# Patient Record
Sex: Female | Born: 1990
Health system: Southern US, Community
[De-identification: ages and names within clinical notes are randomized; demographics above are authoritative.]

## PROBLEM LIST (undated history)

## (undated) ENCOUNTER — Inpatient Hospital Stay: Payer: Self-pay

## (undated) DIAGNOSIS — E669 Obesity, unspecified: Secondary | ICD-10-CM

## (undated) DIAGNOSIS — E66811 Obesity, class 1: Secondary | ICD-10-CM

## (undated) HISTORY — DX: Obesity, class 1: E66.811

## (undated) HISTORY — DX: Obesity, unspecified: E66.9

---
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
## 2010-11-16 ENCOUNTER — Emergency Department: Payer: Self-pay | Admitting: Emergency Medicine

## 2010-11-24 ENCOUNTER — Emergency Department: Payer: Self-pay | Admitting: Emergency Medicine

## 2012-06-11 ENCOUNTER — Emergency Department: Payer: Self-pay | Admitting: Emergency Medicine

## 2012-06-11 LAB — COMPREHENSIVE METABOLIC PANEL
Albumin: 3.9 g/dL (ref 3.4–5.0)
Alkaline Phosphatase: 108 U/L (ref 50–136)
Anion Gap: 5 — ABNORMAL LOW (ref 7–16)
BUN: 10 mg/dL (ref 7–18)
Calcium, Total: 8.9 mg/dL (ref 8.5–10.1)
Co2: 27 mmol/L (ref 21–32)
EGFR (African American): >60
EGFR (Non-African Amer.): >60
Glucose: 87 mg/dL (ref 65–99)
SGOT(AST): 33 U/L (ref 15–37)
SGPT (ALT): 52 U/L (ref 12–78)
Sodium: 137 mmol/L (ref 136–145)

## 2012-06-11 LAB — URINALYSIS, COMPLETE
Ketone: NEGATIVE
Nitrite: NEGATIVE
Ph: 5 (ref 4.5–8.0)
Protein: NEGATIVE
RBC,UR: 1 /HPF (ref 0–5)
Specific Gravity: 1.028 (ref 1.003–1.030)

## 2012-06-11 LAB — CBC
HCT: 41.4 % (ref 35.0–47.0)
HGB: 14.1 g/dL (ref 12.0–16.0)
MCHC: 34.1 g/dL (ref 32.0–36.0)
MCV: 84 fL (ref 80–100)
RDW: 13.6 % (ref 11.5–14.5)
WBC: 6 10*3/uL (ref 3.6–11.0)

## 2013-04-02 ENCOUNTER — Emergency Department: Payer: Self-pay | Admitting: Internal Medicine

## 2013-04-02 LAB — URINALYSIS, COMPLETE
Bacteria: NONE SEEN
Bilirubin,UR: NEGATIVE
Glucose,UR: NEGATIVE mg/dL (ref 0–75)
Ketone: NEGATIVE
RBC,UR: 7 /HPF (ref 0–5)
Specific Gravity: 1.024 (ref 1.003–1.030)
Squamous Epithelial: <1
WBC UR: <1 /HPF (ref 0–5)

## 2013-04-02 LAB — CBC
HCT: 38.9 % (ref 35.0–47.0)
MCH: 27.8 pg (ref 26.0–34.0)
MCV: 83 fL (ref 80–100)
Platelet: 330 10*3/uL (ref 150–440)
RBC: 4.66 10*6/uL (ref 3.80–5.20)
WBC: 6 10*3/uL (ref 3.6–11.0)

## 2013-07-04 HISTORY — PX: DILATION AND CURETTAGE OF UTERUS: SHX78

## 2013-07-14 ENCOUNTER — Observation Stay: Payer: Self-pay

## 2013-07-14 LAB — HCG, QUANTITATIVE, PREGNANCY

## 2014-03-14 ENCOUNTER — Emergency Department: Payer: Self-pay | Admitting: Emergency Medicine

## 2014-03-14 LAB — URINALYSIS, COMPLETE
Bilirubin,UR: NEGATIVE
Blood: NEGATIVE
Glucose,UR: NEGATIVE mg/dL (ref 0–75)
Ketone: NEGATIVE
LEUKOCYTE ESTERASE: NEGATIVE
Nitrite: NEGATIVE
PH: 5 (ref 4.5–8.0)
Protein: NEGATIVE
Specific Gravity: 1.028 (ref 1.003–1.030)

## 2014-03-14 LAB — COMPREHENSIVE METABOLIC PANEL
ALK PHOS: 74 U/L
ALT: 21 U/L
Albumin: 3.7 g/dL (ref 3.4–5.0)
Anion Gap: 7 (ref 7–16)
BILIRUBIN TOTAL: 0.2 mg/dL (ref 0.2–1.0)
BUN: 14 mg/dL (ref 7–18)
CO2: 25 mmol/L (ref 21–32)
Calcium, Total: 8.9 mg/dL (ref 8.5–10.1)
Chloride: 106 mmol/L (ref 98–107)
Creatinine: 0.9 mg/dL (ref 0.60–1.30)
EGFR (Non-African Amer.): >60
Glucose: 98 mg/dL (ref 65–99)
Osmolality: 276 (ref 275–301)
Potassium: 3.8 mmol/L (ref 3.5–5.1)
SGOT(AST): 18 U/L (ref 15–37)
Sodium: 138 mmol/L (ref 136–145)
Total Protein: 8.4 g/dL — ABNORMAL HIGH (ref 6.4–8.2)

## 2014-03-14 LAB — PREGNANCY, URINE: PREGNANCY TEST, URINE: NEGATIVE m[IU]/mL

## 2014-03-14 LAB — CBC
HCT: 41.5 % (ref 35.0–47.0)
HGB: 13.3 g/dL (ref 12.0–16.0)
MCH: 27.5 pg (ref 26.0–34.0)
MCHC: 32.1 g/dL (ref 32.0–36.0)
MCV: 86 fL (ref 80–100)
PLATELETS: 347 10*3/uL (ref 150–440)
RBC: 4.85 10*6/uL (ref 3.80–5.20)
RDW: 13.6 % (ref 11.5–14.5)
WBC: 6.2 10*3/uL (ref 3.6–11.0)

## 2014-03-14 LAB — LIPASE, BLOOD: Lipase: 184 U/L (ref 73–393)

## 2014-05-13 ENCOUNTER — Emergency Department: Payer: Self-pay | Admitting: Emergency Medicine

## 2014-05-13 LAB — URINALYSIS, COMPLETE
BLOOD: NEGATIVE
Bacteria: NONE SEEN
Bilirubin,UR: NEGATIVE
Glucose,UR: NEGATIVE mg/dL (ref 0–75)
Ketone: NEGATIVE
LEUKOCYTE ESTERASE: NEGATIVE
Nitrite: NEGATIVE
PH: 6 (ref 4.5–8.0)
Protein: NEGATIVE
SPECIFIC GRAVITY: 1.018 (ref 1.003–1.030)
WBC UR: 1 /HPF (ref 0–5)

## 2014-05-13 LAB — CBC
HCT: 36.2 % (ref 35.0–47.0)
HGB: 11.7 g/dL — ABNORMAL LOW (ref 12.0–16.0)
MCH: 27.7 pg (ref 26.0–34.0)
MCHC: 32.2 g/dL (ref 32.0–36.0)
MCV: 86 fL (ref 80–100)
PLATELETS: 354 10*3/uL (ref 150–440)
RBC: 4.21 10*6/uL (ref 3.80–5.20)
RDW: 13.7 % (ref 11.5–14.5)
WBC: 7.7 10*3/uL (ref 3.6–11.0)

## 2014-05-13 LAB — COMPREHENSIVE METABOLIC PANEL
AST: 18 U/L (ref 15–37)
Albumin: 3.1 g/dL — ABNORMAL LOW (ref 3.4–5.0)
Alkaline Phosphatase: 70 U/L
Anion Gap: 9 (ref 7–16)
BILIRUBIN TOTAL: 0.2 mg/dL (ref 0.2–1.0)
BUN: 13 mg/dL (ref 7–18)
Calcium, Total: 8.7 mg/dL (ref 8.5–10.1)
Chloride: 108 mmol/L — ABNORMAL HIGH (ref 98–107)
Co2: 24 mmol/L (ref 21–32)
Creatinine: 0.91 mg/dL (ref 0.60–1.30)
Glucose: 91 mg/dL (ref 65–99)
Osmolality: 281 (ref 275–301)
POTASSIUM: 3.8 mmol/L (ref 3.5–5.1)
SGPT (ALT): 24 U/L
SODIUM: 141 mmol/L (ref 136–145)
Total Protein: 7.3 g/dL (ref 6.4–8.2)

## 2014-05-13 LAB — HCG, QUANTITATIVE, PREGNANCY: Beta Hcg, Quant.: 38186 m[IU]/mL — ABNORMAL HIGH

## 2014-06-16 ENCOUNTER — Encounter: Payer: Self-pay | Admitting: Obstetrics and Gynecology

## 2014-06-18 ENCOUNTER — Ambulatory Visit: Payer: Self-pay | Admitting: Obstetrics and Gynecology

## 2014-06-18 LAB — CBC WITH DIFFERENTIAL/PLATELET
Basophil #: 0 10*3/uL (ref 0.0–0.1)
Basophil %: 0.7 %
EOS ABS: 0.1 10*3/uL (ref 0.0–0.7)
Eosinophil %: 1.9 %
HCT: 38.5 % (ref 35.0–47.0)
HGB: 12.4 g/dL (ref 12.0–16.0)
Lymphocyte #: 2.2 10*3/uL (ref 1.0–3.6)
Lymphocyte %: 43.1 %
MCH: 27.8 pg (ref 26.0–34.0)
MCHC: 32.2 g/dL (ref 32.0–36.0)
MCV: 86 fL (ref 80–100)
MONOS PCT: 8.1 %
Monocyte #: 0.4 x10 3/mm (ref 0.2–0.9)
NEUTROS ABS: 2.4 10*3/uL (ref 1.4–6.5)
NEUTROS PCT: 46.2 %
Platelet: 370 10*3/uL (ref 150–440)
RBC: 4.46 10*6/uL (ref 3.80–5.20)
RDW: 13.8 % (ref 11.5–14.5)
WBC: 5.1 10*3/uL (ref 3.6–11.0)

## 2014-06-18 LAB — PROTIME-INR
INR: 0.9
Prothrombin Time: 12.4 secs (ref 11.5–14.7)

## 2014-06-18 LAB — APTT: Activated PTT: 26.7 secs (ref 23.6–35.9)

## 2014-07-04 NOTE — L&D Delivery Note (Signed)
VAGINAL DELIVERY NOTE:  Date of Delivery: 05/18/2015 Primary OB: [facility]  Gestational Age/EDD: [id number] 05/18/2015, by Last Menstrual Period Antepartum complications: Elevated AFP for 1:65 OSB Attending Physician: Ronald PippinsBeaseley/C Jameela Michna, CNM Delivery Type: spontaneous vaginal delivery  Anesthesia: epidural Laceration: none Episiotomy: none Placenta: spontaneous Intrapartum complications: None Estimated Blood Loss: 200mls GBS: Positive Procedure Details: CTSP due to pushing with caput visible. Prepared for vag delivery. Legs in stirrups. Family at bedside. Vtx, ant and post shoulder and body del at 0256 and to mom's abd, Baby cried initially, Nsy RN at bedside. CCx2 and cut per Dad. Cord blood sent. SDOP intact with 200 ml's of blood as Est. Blood loss. No lacs and no epis. Hemostasis achieved, No needles used. Bonding with baby skin to skin.   Baby: Liveborn female, Apgars 8/9, weight not done yet,  baby named "Donna Pittman"     Mom to postpartum.  Baby to Couplet care / Skin to Skin.  Donna Pittman 05/18/2015, 3:14 AM

## 2014-10-21 LAB — OB RESULTS CONSOLE GC/CHLAMYDIA
Chlamydia: NEGATIVE
Gonorrhea: NEGATIVE

## 2014-10-21 LAB — OB RESULTS CONSOLE VARICELLA ZOSTER ANTIBODY, IGG: Varicella: IMMUNE

## 2014-10-21 LAB — OB RESULTS CONSOLE RUBELLA ANTIBODY, IGM: RUBELLA: IMMUNE

## 2014-10-21 LAB — OB RESULTS CONSOLE RPR: RPR: NONREACTIVE

## 2014-10-21 LAB — OB RESULTS CONSOLE ANTIBODY SCREEN: ANTIBODY SCREEN: NEGATIVE

## 2014-10-21 LAB — OB RESULTS CONSOLE ABO/RH: RH TYPE: POSITIVE

## 2014-10-21 LAB — OB RESULTS CONSOLE HIV ANTIBODY (ROUTINE TESTING): HIV: NONREACTIVE

## 2014-10-21 LAB — OB RESULTS CONSOLE HEPATITIS B SURFACE ANTIGEN: HEP B S AG: NEGATIVE

## 2014-10-27 LAB — SURGICAL PATHOLOGY

## 2014-10-28 ENCOUNTER — Other Ambulatory Visit: Payer: Self-pay | Admitting: Obstetrics and Gynecology

## 2014-10-28 DIAGNOSIS — Z369 Encounter for antenatal screening, unspecified: Secondary | ICD-10-CM

## 2014-10-29 NOTE — Op Note (Signed)
PATIENT NAME:  [name], Donna Pittman MR#:  [id number] DATE OF BIRTH:  14-Jun-1991  DATE OF PROCEDURE:  06/18/2014  PREOPERATIVE DIAGNOSIS: Missed spontaneous abortion at 9 weeks.   POSTOPERATIVE DIAGNOSIS: Missed spontaneous abortion at 9 weeks.  PROCEDURE: Suction dilation and curettage.   SURGEON: Cline CoolsBethany E. Liat Mayol, MD   ESTIMATED BLOOD LOSS: 150 mL.   INTRAVENOUS FLUIDS USED: 800 mL.   URINE OUTPUT: 50 mL.   COMPLICATIONS: None.   ANESTHESIA: General LMA.   FINDINGS: Retroverted 9 week sized uterus.   INDICATION FOR PROCEDURE: [name] is a 24 year old gravida 2, para 0-0-1-0  with one prior 17 week fetal loss who presented for an ultrasound at which time spontaneous miscarriage was noted. The decision was made to proceed with surgical management. The [facility] at the time of the diagnosis and the decision was made to send off these products of conception for microarray testing and to proceed with maternal ATLS rule out with a lavender-topped tube sent with the products of conception for maternal contamination.   PROCEDURE: The patient was brought to the operating room where she was placed in a supine position. General anesthesia was induced without difficulty. She was then placed in dorsal lithotomy position and prepped and draped in the usual sterile fashion. A formal timeout procedure was performed with all team members present and in agreement.   She was straight catheterized for 50 mL of clear and yellow urine. Exam under anesthesia revealed that 9 week sized uterus. Her cervix was then serially dilated to 20 [place] and an 8 mm flexible suction curette was introduced carefully to the uterine fundus. The uterine contents were evacuated. Sharp curettage was used to ensure that the uterus was cleared of all clots and debris. The suction curette was re introduced or one final pass. Bimanual massage revealed a slightly boggy uterine fundus, 0.2 mg  Methergine was given IM. The uterus was then firm and bleeding was minimal.   The single-tooth tenaculum that had been used to stabilize the cervix at the time of dilation was then removed and the sites noted to be hemostatic. All instruments were removed from the patient and general anesthesia was reversed without difficulty. She tolerated this procedure well and is stable and alert in recovery.   ____________________________ Cline CoolsBethany E. Arlynn Mcdermid, MD beb:bm D: 06/18/2014 15:52:38 ET T: 06/19/2014 01:10:55 ET JOB#: [id number]  cc: Cline CoolsBethany E. Osby Sweetin, MD, <Dictator> Cline CoolsBETHANY E Adelie Croswell MD ELECTRONICALLY SIGNED 07/15/2014 9:26

## 2014-11-10 ENCOUNTER — Other Ambulatory Visit: Payer: Self-pay | Admitting: Obstetrics and Gynecology

## 2014-11-10 ENCOUNTER — Ambulatory Visit
Admission: RE | Admit: 2014-11-10 | Discharge: 2014-11-10 | Disposition: A | Discharge status: Home / Self Care | Payer: Medicaid Other | Source: Ambulatory Visit | Attending: Obstetrics & Gynecology | Admitting: Obstetrics & Gynecology

## 2014-11-10 ENCOUNTER — Ambulatory Visit
Admission: RE | Admit: 2014-11-10 | Discharge: 2014-11-10 | Disposition: A | Discharge status: Home / Self Care | Payer: Medicaid Other | Source: Ambulatory Visit | Attending: Obstetrics and Gynecology | Admitting: Obstetrics and Gynecology

## 2014-11-10 ENCOUNTER — Other Ambulatory Visit: Payer: Self-pay | Admitting: Obstetrics & Gynecology

## 2014-11-10 DIAGNOSIS — Z36 Encounter for antenatal screening of mother: Secondary | ICD-10-CM | POA: Insufficient documentation

## 2014-11-10 DIAGNOSIS — Z369 Encounter for antenatal screening, unspecified: Secondary | ICD-10-CM

## 2014-11-10 DIAGNOSIS — Z3A13 13 weeks gestation of pregnancy: Secondary | ICD-10-CM | POA: Diagnosis not present

## 2014-11-17 ENCOUNTER — Telehealth: Payer: Self-pay | Admitting: Obstetrics and Gynecology

## 2014-11-17 NOTE — Telephone Encounter (Signed)
  [name] elected to undergo First Trimester screening on 11/10/2014.  To review, first trimester screening, includes nuchal translucency ultrasound screen and/or first trimester maternal serum marker screening.  The nuchal translucency has approximately an 80% detection rate for Down syndrome and can be positive for other chromosome abnormalities as well as heart defects.  When combined with a maternal serum marker screening, the detection rate is up to 90% for Down syndrome and up to 97% for trisomy 13 and 18.     The results of the First Trimester Nuchal Translucency and Biochemical Screening were within normal range.  The risk for Down syndrome is now estimated to be less than 1 in 10,000.  The risk for Trisomy 13/18 is also estimated to be less than 1 in 10,000.  Should more definitive information be desired, we would offer amniocentesis.  Because we do not yet know the effectiveness of combined first and second trimester screening, we do not recommend a maternal serum screen to assess the chance for chromosome conditions.  However, if screening for neural tube defects is desired, maternal serum screening for AFP only can be performed between 15 and [redacted] weeks gestation.

## 2015-02-17 ENCOUNTER — Emergency Department
Admission: EM | Admit: 2015-02-17 | Discharge: 2015-02-17 | Disposition: A | Discharge status: Home / Self Care | Payer: No Typology Code available for payment source | Source: Home / Self Care | Attending: Emergency Medicine | Admitting: Emergency Medicine

## 2015-02-17 ENCOUNTER — Emergency Department: Payer: No Typology Code available for payment source

## 2015-02-17 ENCOUNTER — Encounter: Payer: Self-pay | Admitting: *Deleted

## 2015-02-17 ENCOUNTER — Observation Stay
Admission: EM | Admit: 2015-02-17 | Discharge: 2015-02-17 | Disposition: A | Discharge status: Home / Self Care | Payer: No Typology Code available for payment source | Attending: Obstetrics and Gynecology | Admitting: Obstetrics and Gynecology

## 2015-02-17 DIAGNOSIS — Y92410 Unspecified street and highway as the place of occurrence of the external cause: Secondary | ICD-10-CM

## 2015-02-17 DIAGNOSIS — M62830 Muscle spasm of back: Secondary | ICD-10-CM | POA: Insufficient documentation

## 2015-02-17 DIAGNOSIS — Y9389 Activity, other specified: Secondary | ICD-10-CM | POA: Insufficient documentation

## 2015-02-17 DIAGNOSIS — Y998 Other external cause status: Secondary | ICD-10-CM | POA: Insufficient documentation

## 2015-02-17 DIAGNOSIS — O26899 Other specified pregnancy related conditions, unspecified trimester: Secondary | ICD-10-CM | POA: Insufficient documentation

## 2015-02-17 DIAGNOSIS — O9A212 Injury, poisoning and certain other consequences of external causes complicating pregnancy, second trimester: Secondary | ICD-10-CM | POA: Insufficient documentation

## 2015-02-17 DIAGNOSIS — Z79899 Other long term (current) drug therapy: Secondary | ICD-10-CM

## 2015-02-17 DIAGNOSIS — S39012A Strain of muscle, fascia and tendon of lower back, initial encounter: Secondary | ICD-10-CM

## 2015-02-17 DIAGNOSIS — S3982XA Other specified injuries of lower back, initial encounter: Principal | ICD-10-CM | POA: Insufficient documentation

## 2015-02-17 DIAGNOSIS — Z3A27 27 weeks gestation of pregnancy: Secondary | ICD-10-CM | POA: Insufficient documentation

## 2015-02-17 DIAGNOSIS — Z3490 Encounter for supervision of normal pregnancy, unspecified, unspecified trimester: Secondary | ICD-10-CM

## 2015-02-17 LAB — CBC
HEMATOCRIT: 34.7 % — AB (ref 35.0–47.0)
HEMOGLOBIN: 11.5 g/dL — AB (ref 12.0–16.0)
MCH: 27.6 pg (ref 26.0–34.0)
MCHC: 33.2 g/dL (ref 32.0–36.0)
MCV: 83.2 fL (ref 80.0–100.0)
Platelets: 371 10*3/uL (ref 150–440)
RBC: 4.18 MIL/uL (ref 3.80–5.20)
RDW: 14 % (ref 11.5–14.5)
WBC: 10 10*3/uL (ref 3.6–11.0)

## 2015-02-17 MED ORDER — ACETAMINOPHEN 500 MG PO TABS
1000.0000 mg | ORAL_TABLET | ORAL | Status: AC
  Administered 2015-02-17: 1000 mg via ORAL
  Filled 2015-02-17: qty 2
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
## 2015-02-17 NOTE — Discharge Instructions (Signed)

## 2015-03-16 ENCOUNTER — Observation Stay
Admission: EM | Admit: 2015-03-16 | Discharge: 2015-03-16 | Disposition: A | Discharge status: Home Health | Payer: Medicaid Other | Attending: Obstetrics and Gynecology | Admitting: Obstetrics and Gynecology

## 2015-03-16 DIAGNOSIS — R198 Other specified symptoms and signs involving the digestive system and abdomen: Secondary | ICD-10-CM | POA: Diagnosis present

## 2015-03-16 DIAGNOSIS — O26899 Other specified pregnancy related conditions, unspecified trimester: Secondary | ICD-10-CM | POA: Diagnosis not present

## 2015-03-16 LAB — URINALYSIS COMPLETE WITH MICROSCOPIC (ARMC ONLY)
BILIRUBIN URINE: NEGATIVE
Glucose, UA: NEGATIVE mg/dL
HGB URINE DIPSTICK: NEGATIVE
KETONES UR: NEGATIVE mg/dL
LEUKOCYTES UA: NEGATIVE
Nitrite: NEGATIVE
PH: 6 (ref 5.0–8.0)
Protein, ur: NEGATIVE mg/dL
SPECIFIC GRAVITY, URINE: 1.015 (ref 1.005–1.030)

## 2015-03-16 LAB — FETAL FIBRONECTIN: Fetal Fibronectin: NEGATIVE

## 2015-03-16 NOTE — OB Triage Note (Signed)
Donna Pittman here with c/o abdominal tightness and LOF, both which started 1 hour ago, +FM. Denies bleeding. No intercourse last 24 hours. No recent activity changes, no abdominal trauma.

## 2015-03-16 NOTE — Progress Notes (Signed)
Pt given d/c inst and verbalized understanding.   D/C home in stable condition with FOB.

## 2015-04-21 LAB — OB RESULTS CONSOLE GBS
STREP GROUP B AG: NEGATIVE
STREP GROUP B AG: POSITIVE

## 2015-04-21 LAB — OB RESULTS CONSOLE RPR: RPR: NONREACTIVE

## 2015-04-21 LAB — OB RESULTS CONSOLE GC/CHLAMYDIA
Chlamydia: NEGATIVE
Gonorrhea: NEGATIVE

## 2015-05-11 ENCOUNTER — Emergency Department
Admission: EM | Admit: 2015-05-11 | Discharge: 2015-05-11 | Disposition: A | Discharge status: Home / Self Care | Payer: Medicaid Other | Attending: Emergency Medicine | Admitting: Emergency Medicine

## 2015-05-11 ENCOUNTER — Encounter: Payer: Self-pay | Admitting: Emergency Medicine

## 2015-05-11 DIAGNOSIS — L02412 Cutaneous abscess of left axilla: Secondary | ICD-10-CM

## 2015-05-11 DIAGNOSIS — Z3A39 39 weeks gestation of pregnancy: Secondary | ICD-10-CM | POA: Diagnosis not present

## 2015-05-11 DIAGNOSIS — O99713 Diseases of the skin and subcutaneous tissue complicating pregnancy, third trimester: Secondary | ICD-10-CM | POA: Diagnosis present

## 2015-05-11 DIAGNOSIS — Z79899 Other long term (current) drug therapy: Secondary | ICD-10-CM | POA: Diagnosis not present

## 2015-05-11 MED ORDER — LIDOCAINE-EPINEPHRINE (PF) 1 %-1:200000 IJ SOLN
30.0000 mL | Freq: Once | INTRAMUSCULAR | Status: DC
  Filled 2015-05-11: qty 30

## 2015-05-11 MED ORDER — CLINDAMYCIN HCL 300 MG PO CAPS: 300.0000 mg | ORAL_CAPSULE | Freq: Four times a day (QID) | ORAL | Status: DC

## 2015-05-11 NOTE — ED Notes (Signed)
Developed an abscess under left arm about 3 days ago. Area is draining a small amt of pus

## 2015-05-11 NOTE — ED Notes (Signed)
Pt to ed with c/o abscess to left axillary area x 4 days.  Pt reports swelling increasing each day.
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
## 2015-05-11 NOTE — Discharge Instructions (Signed)
Incision and Drainage Incision and drainage is a procedure in which a sac-like structure (cystic structure) is opened and drained. The area to be drained usually contains material such as pus, fluid, or blood.  LET YOUR CAREGIVER KNOW ABOUT:   Allergies to medicine.  Medicines taken, including vitamins, herbs, eyedrops, over-the-counter medicines, and creams.  Use of steroids (by mouth or creams).  Previous problems with anesthetics or numbing medicines.  History of bleeding problems or blood clots.  Previous surgery.  Other health problems, including diabetes and kidney problems.  Possibility of pregnancy, if this applies. RISKS AND COMPLICATIONS  Pain.  Bleeding.  Scarring.  Infection. BEFORE THE PROCEDURE  You may need to have an ultrasound or other imaging tests to see how large or deep your cystic structure is. Blood tests may also be used to determine if you have an infection or how severe the infection is. You may need to have a tetanus shot. PROCEDURE  The affected area is cleaned with a cleaning fluid. The cyst area will then be numbed with a medicine (local anesthetic). A small incision will be made in the cystic structure. A syringe or catheter may be used to drain the contents of the cystic structure, or the contents may be squeezed out. The area will then be flushed with a cleansing solution. After cleansing the area, it is often gently packed with a gauze or another wound dressing. Once it is packed, it will be covered with gauze and tape or some other type of wound dressing. AFTER THE PROCEDURE   Often, you will be allowed to go home right after the procedure.  You may be given antibiotic medicine to prevent or heal an infection.  If the area was packed with gauze or some other wound dressing, you will likely need to come back in 1 to 2 days to get it removed.  The area should heal in about 14 days.   This information is not intended to replace advice given  to you by your health care provider. Make sure you discuss any questions you have with your health care provider.   Document Released: 12/14/2000 Document Revised: 12/20/2011 Document Reviewed: 08/15/2011 Elsevier Interactive Patient Education [employer]2016 Elsevier Inc.   Keep the wound clean, dry, and covered.  Apply warm compresses to promote healing.  Take the antibiotic as directed. Follow-up with Dr. Dalbert GarnetBeasley to have the packing removed in 2-3 days.

## 2015-05-17 ENCOUNTER — Inpatient Hospital Stay
Admission: EM | Admit: 2015-05-17 | Discharge: 2015-05-19 | DRG: 775 | Disposition: A | Discharge status: Home / Self Care | Payer: Medicaid Other | Attending: Obstetrics and Gynecology | Admitting: Obstetrics and Gynecology

## 2015-05-17 ENCOUNTER — Inpatient Hospital Stay: Payer: Medicaid Other | Admitting: Anesthesiology

## 2015-05-17 ENCOUNTER — Encounter: Payer: Self-pay | Admitting: *Deleted

## 2015-05-17 DIAGNOSIS — Z3A39 39 weeks gestation of pregnancy: Secondary | ICD-10-CM

## 2015-05-17 DIAGNOSIS — O99824 Streptococcus B carrier state complicating childbirth: Principal | ICD-10-CM | POA: Diagnosis present

## 2015-05-17 LAB — TYPE AND SCREEN
ABO/RH(D): O POS
Antibody Screen: NEGATIVE

## 2015-05-17 LAB — CBC
HCT: 32.4 % — ABNORMAL LOW (ref 35.0–47.0)
HEMOGLOBIN: 10.5 g/dL — AB (ref 12.0–16.0)
MCH: 26.1 pg (ref 26.0–34.0)
MCHC: 32.4 g/dL (ref 32.0–36.0)
MCV: 80.6 fL (ref 80.0–100.0)
Platelets: 340 10*3/uL (ref 150–440)
RBC: 4.02 MIL/uL (ref 3.80–5.20)
RDW: 16.4 % — ABNORMAL HIGH (ref 11.5–14.5)
WBC: 7.3 10*3/uL (ref 3.6–11.0)

## 2015-05-17 LAB — ABO/RH: ABO/RH(D): O POS

## 2015-05-17 MED ORDER — PENICILLIN G POTASSIUM 5000000 UNITS IJ SOLR
2.5000 10*6.[IU] | INTRAVENOUS | Status: DC
  Administered 2015-05-17 – 2015-05-18 (×4): 2.5 10*6.[IU] via INTRAVENOUS
  Filled 2015-05-17 (×7): qty 2.5

## 2015-05-17 MED ORDER — LIDOCAINE HCL (PF) 1 % IJ SOLN
30.0000 mL | INTRAMUSCULAR | Status: DC | PRN
  Filled 2015-05-17: qty 30

## 2015-05-17 MED ORDER — FENTANYL 2.5 MCG/ML W/ROPIVACAINE 0.2% IN NS 100 ML EPIDURAL INFUSION (ARMC-ANES)
EPIDURAL | Status: AC
  Filled 2015-05-17: qty 100

## 2015-05-17 MED ORDER — ACETAMINOPHEN 325 MG PO TABS: 650.0000 mg | ORAL_TABLET | ORAL | Status: DC | PRN

## 2015-05-17 MED ORDER — FENTANYL 2.5 MCG/ML W/ROPIVACAINE 0.2% IN NS 100 ML EPIDURAL INFUSION (ARMC-ANES)
EPIDURAL | Status: AC
  Administered 2015-05-17: 10 mL/h via EPIDURAL
  Filled 2015-05-17: qty 100

## 2015-05-17 MED ORDER — OXYTOCIN BOLUS FROM INFUSION
500.0000 mL | INTRAVENOUS | Status: DC
  Administered 2015-05-18: 500 mL via INTRAVENOUS

## 2015-05-17 MED ORDER — BUPIVACAINE HCL (PF) 0.25 % IJ SOLN
INTRAMUSCULAR | Status: DC | PRN
  Administered 2015-05-17 (×2): 5 mL via EPIDURAL

## 2015-05-17 MED ORDER — MISOPROSTOL 25 MCG QUARTER TABLET
50.0000 ug | ORAL_TABLET | Freq: Once | ORAL | Status: AC
Start: 2015-05-17 — End: 2015-05-17
  Administered 2015-05-17: 50 ug via VAGINAL
  Filled 2015-05-17: qty 0.5

## 2015-05-17 MED ORDER — BUTORPHANOL TARTRATE 1 MG/ML IJ SOLN
1.0000 mg | INTRAMUSCULAR | Status: DC | PRN
  Administered 2015-05-17: 2 mg via INTRAVENOUS
  Filled 2015-05-17: qty 2

## 2015-05-17 MED ORDER — OXYTOCIN 40 UNITS IN LACTATED RINGERS INFUSION - SIMPLE MED
62.5000 mL/h | INTRAVENOUS | Status: DC
  Administered 2015-05-18: 62.5 mL/h via INTRAVENOUS

## 2015-05-17 MED ORDER — LACTATED RINGERS IV SOLN
INTRAVENOUS | Status: DC
  Administered 2015-05-17 (×2): via INTRAVENOUS

## 2015-05-17 MED ORDER — TERBUTALINE SULFATE 1 MG/ML IJ SOLN: 0.2500 mg | Freq: Once | INTRAMUSCULAR | Status: DC | PRN

## 2015-05-17 MED ORDER — PHENYLEPHRINE 40 MCG/ML (10ML) SYRINGE FOR IV PUSH (FOR BLOOD PRESSURE SUPPORT)
80.0000 ug | PREFILLED_SYRINGE | INTRAVENOUS | Status: DC | PRN
Start: 2015-05-17 — End: 2015-05-18
  Filled 2015-05-17: qty 2

## 2015-05-17 MED ORDER — MISOPROSTOL 25 MCG QUARTER TABLET: 25.0000 ug | ORAL_TABLET | ORAL | Status: DC | PRN

## 2015-05-17 MED ORDER — DIPHENHYDRAMINE HCL 50 MG/ML IJ SOLN: 12.5000 mg | INTRAMUSCULAR | Status: DC | PRN

## 2015-05-17 MED ORDER — FENTANYL 2.5 MCG/ML W/ROPIVACAINE 0.2% IN NS 100 ML EPIDURAL INFUSION (ARMC-ANES): 10.0000 mL/h | EPIDURAL | Status: DC

## 2015-05-17 MED ORDER — LACTATED RINGERS IV SOLN: 500.0000 mL | INTRAVENOUS | Status: DC | PRN

## 2015-05-17 MED ORDER — EPHEDRINE 5 MG/ML INJ
10.0000 mg | INTRAVENOUS | Status: DC | PRN
Start: 2015-05-17 — End: 2015-05-18
  Filled 2015-05-17: qty 2

## 2015-05-17 MED ORDER — CITRIC ACID-SODIUM CITRATE 334-500 MG/5ML PO SOLN
30.0000 mL | ORAL | Status: DC | PRN
  Filled 2015-05-17: qty 30

## 2015-05-17 MED ORDER — DEXTROSE 5 % IV SOLN
5.0000 10*6.[IU] | Freq: Once | INTRAVENOUS | Status: AC
  Administered 2015-05-17: 5 10*6.[IU] via INTRAVENOUS
  Filled 2015-05-17: qty 5

## 2015-05-17 MED ORDER — MISOPROSTOL 25 MCG QUARTER TABLET
25.0000 ug | ORAL_TABLET | ORAL | Status: DC
  Administered 2015-05-17 (×2): 25 ug via VAGINAL
  Filled 2015-05-17 (×2): qty 0.25

## 2015-05-17 MED ORDER — ONDANSETRON HCL 4 MG/2ML IJ SOLN: 4.0000 mg | Freq: Four times a day (QID) | INTRAMUSCULAR | Status: DC | PRN

## 2015-05-17 MED ORDER — LIDOCAINE-EPINEPHRINE (PF) 1.5 %-1:200000 IJ SOLN
INTRAMUSCULAR | Status: DC | PRN
  Administered 2015-05-17: 5 mL via EPIDURAL

## 2015-05-17 NOTE — Progress Notes (Signed)
S: Resting comfortably with epidural. + CTX, + LOF, No VB O: Filed Vitals:   05/17/15 2130 05/17/15 2135 05/17/15 2138 05/17/15 2140  BP:   132/93 113/59  Pulse:      Temp:      TempSrc:      Resp:      Height:      Weight:      SpO2: 100% 99%      Gen: NAD, AAOx3      Abd: FNTTP      Ext: Non-tender, Nonedmeatous    FHT: + mod var + accelerations,  1 deceleration to 90 x50 secs TOCO: Q  2 mins, IUPC inserted and 40-50 mm SVE: 3/100/vtx-1   A/P:  24 y.o. yo G4P1030 at 552w6d for Elevated OSB/AFP.   Labor:progressing  FWB: Reassuring Cat 1 tracing.   GBS: pos  Continue to monitor UC/FHT's  Start Pitocin per protocol to augment labor process.    Donna Pittman 9:51 PM

## 2015-05-17 NOTE — H&P (Signed)
HISTORY AND PHYSICAL  HISTORY OF PRESENT ILLNESS: [name] is a 24 y.o. G3P0020 at 7815w6d by LMP consistent with 10 5/7  week ultrasound with a pregnancy complicated by IUGR (resolved), Polyhydramnios (resolved) and Elevated AFP with normal US findings (Elevated for Open Spinal Bifida 1:65) presenting for induction of labor. Pt was seen by [facility] initially for NT which was neg.[facility] after elevated Open spinal bifida and cleared as normal anatomy. Pt had  prior SAB at 17 weeks and cx lengths were visualized by US and normal. NO 17 hp needed as did not have PTD. No funneling ever viewed.  PMH of MRSA tx during the pregnancy.  She has not been having contractions and denies leakage of fluid, vaginal bleeding, or decreased fetal movement.   [facility] for Elevated AFP (normal US and [facility]) consult, Poly (resolved) and IUGR (resolved). IOL recommended by [facility] High Risk Committee based on abnormal AFP and risks for post-dates for this pt. Pt is aware of the risks, benefits and alternatives with iOL and agrees with plan of care.   REVIEW OF SYSTEMS: A complete review of systems was performed and was specifically negative for headache, changes in vision, RUQ pain, shortness of breath, chest pain, lower extremity edema and dysuria. +emesis x 1 this am  HISTORY:  PMH: Trich, Abnormal OSB, Obesity, MRSA  Past Surgical History  Procedure Laterality Date  . Dilation and curettage of uterus      MEDS: PNV   No Known Allergies  OB History  Gravida Para Term Preterm AB SAB TAB Ectopic Multiple Living  3 0 0 0 2     0    # Outcome Date GA Lbr Len/2nd Weight Sex Delivery Anes PTL Lv  3 Current           2 AB           1 AB              17 week SAB with no cx incompetence detected Gynecologic History: History of Abnormal Pap Smear: not found History of STI: Trich with TOC neg after tx   Social History  Substance Use Topics  . Smoking status:  Never Smoker   . Smokeless tobacco: Not on file  . Alcohol Use: No    PHYSICAL EXAM:    GENERAL: NAD AAOx3 CHEST:CTAB no increased work of breathing CV:RRR no appreciable murmurs, rubs, gallops ABDOMEN: gravid, nontender, EFW 7ibs9oz  by [place] EXTREMITIES:  Warm and well-perfused, nontender, nonedematous,2+/0  DTRs  CERVIX: 1-2/50/ vtx-2 SPECULUM: not done  FHTs 140  baseline with modvariability accelerations and  No  decelerations  Toco:occas UC noted   PRENATAL STUDIES:  Prenatal Labs:  MBT: ; Rubella immune, Varicella immune, HIV neg, RPR neg, Hep B neg, GC/CT neg, GBS Pos , glucola WNL  Last US  32 wks  (24 %ile) placenta  Ant above the os, AF 13.9cms, [facility]  01/01/15 US 18% with fu in 3rd trimester Multiple cx lengths with no funneling seen on 12/04/14, 12/29/14, 12/24/14, 02/12/15, 03/26/15 ASSESSMENT AND PLAN:  1. Fetal Well being  - Fetal US at  32 wks  reviewed, as above - Group B Streptococcus: pos  Vtx  confirmed by CJ, CNM  2. Routine OB: - Prenatal labs reviewed, as above - Rh O pos GBS pos: PCN IV per protocol  3. Induction of Labor:  -  Contractions external toco  in place -  Pelvis proven to none -  Plan for induction with  Cytotec since pt was re-scheduled from last pm due to staffing, Cervidil will be not be placed. Will proceed with Cytotec hoping for active labor process today.        4. Post Partum Planning: - Infant feeding: Breast  5. Lt axillary abcess healing and treated by ABX and I&D

## 2015-05-17 NOTE — Progress Notes (Addendum)
3ml   1.5% Lido with epi    2 ml additional

## 2015-05-17 NOTE — Progress Notes (Signed)
IUPC placed

## 2015-05-17 NOTE — Anesthesia Preprocedure Evaluation (Signed)
Anesthesia Evaluation  Patient identified by MRN, date of birth, ID band Patient awake    Reviewed: Allergy & Precautions, NPO status , Patient's Chart, lab work & pertinent test results  Airway Mallampati: II  TM Distance: >3 FB Neck ROM: Full    Dental no notable dental hx.    Pulmonary neg pulmonary ROS,    Pulmonary exam normal breath sounds clear to auscultation       Cardiovascular negative cardio ROS Normal cardiovascular exam     Neuro/Psych negative neurological ROS  negative psych ROS   GI/Hepatic negative GI ROS, Neg liver ROS,   Endo/Other  negative endocrine ROS  Renal/GU negative Renal ROS     Musculoskeletal negative musculoskeletal ROS (+)   Abdominal Normal abdominal exam  (+)   Peds negative pediatric ROS (+)  Hematology negative hematology ROS (+)   Anesthesia Other Findings   Reproductive/Obstetrics                             Anesthesia Physical Anesthesia Plan  ASA: II  Anesthesia Plan: Epidural   Post-op Pain Management:    Induction:   Airway Management Planned: Natural Airway  Additional Equipment:   Intra-op Plan:   Post-operative Plan:   Informed Consent: I have reviewed the patients History and Physical, chart, labs and discussed the procedure including the risks, benefits and alternatives for the proposed anesthesia with the patient or authorized representative who has indicated his/her understanding and acceptance.   Dental advisory given  Plan Discussed with: CRNA and Surgeon  Anesthesia Plan Comments:         Anesthesia Quick Evaluation

## 2015-05-17 NOTE — Progress Notes (Signed)
S: Resting comfortably with Cytotec induction.. + CTX occasional, no LOF, VB O: Filed Vitals:   05/17/15 0810 05/17/15 1405 05/17/15 1406  BP: 119/75 130/85   Pulse: 92 92   Temp: 98.2 F (36.8 C)  98.4 F (36.9 C)  TempSrc: Oral  Oral  Resp: 18  18  Height: 5\' 7"  (1.702 m)    Weight: 230 lb (104.327 kg)      Gen: NAD, AAOx3      Abd: FNTTP      Ext: Non-tender, Nonedmeatous    FHT:+ mod var + accelerations no decelerations TOCO: irreg SVE:not reached per RN at last placement of Cytotec   A/P:  24 y.o. yo G3P0020 at 133w6d for Elevated AFP..   Labor: IOL  FWB: Reassuring Cat 1 tracing  GBS: pos     Sharee Pimplearon W Lener Ventresca 4:34 PM

## 2015-05-17 NOTE — Progress Notes (Signed)
S: Becoming uncomfortable with contractions. + CTX, no LOF, VB. Pt wants a break at 2130 to eat and shower before the next Cytotec is inserted. Pt wondering why things are not progressing faster.  Ceasar Mons: Filed Vitals:   05/17/15 0810 05/17/15 1405 05/17/15 1406  BP: 119/75 130/85   Pulse: 92 92   Temp: 98.2 F (36.8 C)  98.4 F (36.9 C)  TempSrc: Oral  Oral  Resp: 18  18  Height: 5\' 7"  (1.702 m)    Weight: 230 lb (104.327 kg)     Gen: NAD, AAOx3      Abd: FNTTP      Ext: Non-tender, Nonedmeatous    FHT: + mod var + accelerations no decelerations Cat 1 TOCO: Q 1 1/2 to 2 min SVE: 1/50%/vtx-2   A/P:  24 y.o. yo G3P0020 at 6887w6d for IOL for Elevated AFP with normal US. Lt axillary abcess healing after packing removed this week.   Labor: IOL at term due to 1:65 chance of OSB.  FWB: Reassuring Cat 1 tracing.  GBS: Pos with ABX being given  Lt axillary abcess with ABX coverage and I&D last week   Sharee Pimplearon W Emelyn Roen 6:39 PM

## 2015-05-17 NOTE — Progress Notes (Signed)
5ml  .25% Marcaine

## 2015-05-17 NOTE — Anesthesia Procedure Notes (Addendum)
Epidural Patient location during procedure: OB Start time: 05/17/2015 9:20 PM  Staffing Resident/CRNA: Clovis FredricksonRISSON, Perian Tedder Performed by: resident/CRNA   Preanesthetic Checklist Completed: patient identified, site marked, surgical consent, pre-op evaluation, timeout performed, IV checked, risks and benefits discussed and monitors and equipment checked  Epidural Patient position: sitting Prep: Betadine Patient monitoring: continuous pulse ox and blood pressure Approach: midline Location: L3-L4 Injection technique: LOR air  Needle:  Needle type: Tuohy  Needle gauge: 18 G Needle length: 9 cm Needle insertion depth: 8 cm Catheter type: closed end Catheter size: 20 Guage Catheter at skin depth: 12 cm Test dose: negative and 1.5% lidocaine with Epi 1:200 K  Assessment Sensory level: T9 Events: blood not aspirated, injection not painful, no injection resistance and no paresthesia

## 2015-05-17 NOTE — Progress Notes (Signed)
5ml of .25% Marcaine.

## 2015-05-17 NOTE — Progress Notes (Signed)
Catheter placed

## 2015-05-17 NOTE — Progress Notes (Signed)
S: Resting comfortably with no epidural. + CTX, no LOF, VB. IOL for elevated  O: Filed Vitals:   05/17/15 0810  BP: 119/75  Pulse: 92  Temp: 98.2 F (36.8 C)  TempSrc: Oral  Resp: 18  Height: 5\' 7"  (1.702 m)  Weight: 230 lb (104.327 kg)    Gen: NAD, AAOx3      Abd: FNTTP      Ext: Non-tender, Nonedmeatous    FHT:+ mod var + accelerations no decelerations TOCO:none SVE: not reevaluated   A/P:  24 y.o. yo G3P0020 at 5612w6d for  IOl.    Labor: none  FWB: Reassuring Cat 1 trac  GBS:pos  Cytotec per protocol  ABX  For GBS +   Sharee Pimplearon W Jones 12:04 PM

## 2015-05-17 NOTE — Progress Notes (Signed)
Sitting up for epidural

## 2015-05-18 LAB — CBC
HEMATOCRIT: 32.4 % — AB (ref 35.0–47.0)
HEMOGLOBIN: 10.1 g/dL — AB (ref 12.0–16.0)
MCH: 25.3 pg — ABNORMAL LOW (ref 26.0–34.0)
MCHC: 31.1 g/dL — AB (ref 32.0–36.0)
MCV: 81.2 fL (ref 80.0–100.0)
Platelets: 306 10*3/uL (ref 150–440)
RBC: 3.99 MIL/uL (ref 3.80–5.20)
RDW: 16.5 % — ABNORMAL HIGH (ref 11.5–14.5)
WBC: 11.2 10*3/uL — AB (ref 3.6–11.0)

## 2015-05-18 LAB — RPR: RPR Ser Ql: NONREACTIVE

## 2015-05-18 MED ORDER — OXYTOCIN 40 UNITS IN LACTATED RINGERS INFUSION - SIMPLE MED: 1.0000 m[IU]/min | INTRAVENOUS | Status: DC

## 2015-05-18 MED ORDER — BISACODYL 10 MG RE SUPP: 10.0000 mg | Freq: Every day | RECTAL | Status: DC | PRN

## 2015-05-18 MED ORDER — FLEET ENEMA 7-19 GM/118ML RE ENEM: 1.0000 | ENEMA | Freq: Every day | RECTAL | Status: DC | PRN

## 2015-05-18 MED ORDER — SENNOSIDES-DOCUSATE SODIUM 8.6-50 MG PO TABS: 2.0000 | ORAL_TABLET | ORAL | Status: DC

## 2015-05-18 MED ORDER — LIDOCAINE HCL (PF) 1 % IJ SOLN
INTRAMUSCULAR | Status: AC
  Filled 2015-05-18: qty 30

## 2015-05-18 MED ORDER — IBUPROFEN 600 MG PO TABS
600.0000 mg | ORAL_TABLET | Freq: Four times a day (QID) | ORAL | Status: DC
  Administered 2015-05-18 – 2015-05-19 (×4): 600 mg via ORAL
  Filled 2015-05-18 (×4): qty 1

## 2015-05-18 MED ORDER — ACETAMINOPHEN 325 MG PO TABS: 650.0000 mg | ORAL_TABLET | ORAL | Status: DC | PRN

## 2015-05-18 MED ORDER — SODIUM CHLORIDE 0.9 % IJ SOLN: 3.0000 mL | Freq: Two times a day (BID) | INTRAMUSCULAR | Status: DC

## 2015-05-18 MED ORDER — ONDANSETRON HCL 4 MG PO TABS: 4.0000 mg | ORAL_TABLET | ORAL | Status: DC | PRN

## 2015-05-18 MED ORDER — BENZOCAINE-MENTHOL 20-0.5 % EX AERO: 1.0000 "application " | INHALATION_SPRAY | CUTANEOUS | Status: DC | PRN

## 2015-05-18 MED ORDER — SODIUM CHLORIDE 0.9 % IV SOLN: 250.0000 mL | INTRAVENOUS | Status: DC | PRN

## 2015-05-18 MED ORDER — SODIUM CHLORIDE 0.9 % IJ SOLN: 3.0000 mL | INTRAMUSCULAR | Status: DC | PRN

## 2015-05-18 MED ORDER — OXYCODONE-ACETAMINOPHEN 5-325 MG PO TABS
1.0000 | ORAL_TABLET | ORAL | Status: DC | PRN
  Administered 2015-05-18: 1 via ORAL
  Filled 2015-05-18: qty 1

## 2015-05-18 MED ORDER — LANOLIN HYDROUS EX OINT: TOPICAL_OINTMENT | CUTANEOUS | Status: DC | PRN

## 2015-05-18 MED ORDER — PRENATAL MULTIVITAMIN CH: 1.0000 | ORAL_TABLET | Freq: Every day | ORAL | Status: DC

## 2015-05-18 MED ORDER — OXYTOCIN 10 UNIT/ML IJ SOLN
INTRAMUSCULAR | Status: AC
  Filled 2015-05-18: qty 2

## 2015-05-18 MED ORDER — MISOPROSTOL 200 MCG PO TABS
ORAL_TABLET | ORAL | Status: AC
  Filled 2015-05-18: qty 4

## 2015-05-18 MED ORDER — OXYCODONE-ACETAMINOPHEN 5-325 MG PO TABS
2.0000 | ORAL_TABLET | ORAL | Status: DC | PRN
  Administered 2015-05-18: 2 via ORAL
  Filled 2015-05-18: qty 2

## 2015-05-18 MED ORDER — TETANUS-DIPHTH-ACELL PERTUSSIS 5-2.5-18.5 LF-MCG/0.5 IM SUSP: 0.5000 mL | Freq: Once | INTRAMUSCULAR | Status: DC

## 2015-05-18 MED ORDER — OXYTOCIN 40 UNITS IN LACTATED RINGERS INFUSION - SIMPLE MED
INTRAVENOUS | Status: AC
  Filled 2015-05-18: qty 1000

## 2015-05-18 MED ORDER — ZOLPIDEM TARTRATE 5 MG PO TABS: 5.0000 mg | ORAL_TABLET | Freq: Every evening | ORAL | Status: DC | PRN

## 2015-05-18 MED ORDER — DIPHENHYDRAMINE HCL 25 MG PO CAPS: 25.0000 mg | ORAL_CAPSULE | Freq: Four times a day (QID) | ORAL | Status: DC | PRN

## 2015-05-18 MED ORDER — DIBUCAINE 1 % RE OINT: 1.0000 "application " | TOPICAL_OINTMENT | RECTAL | Status: DC | PRN

## 2015-05-18 MED ORDER — SIMETHICONE 80 MG PO CHEW: 80.0000 mg | CHEWABLE_TABLET | ORAL | Status: DC | PRN

## 2015-05-18 MED ORDER — AMMONIA AROMATIC IN INHA
RESPIRATORY_TRACT | Status: AC
  Filled 2015-05-18: qty 10

## 2015-05-18 MED ORDER — WITCH HAZEL-GLYCERIN EX PADS: 1.0000 "application " | MEDICATED_PAD | CUTANEOUS | Status: DC | PRN

## 2015-05-18 MED ORDER — POLYSACCHARIDE IRON COMPLEX 150 MG PO CAPS: 150.0000 mg | ORAL_CAPSULE | Freq: Every day | ORAL | Status: DC

## 2015-05-18 MED ORDER — OXYTOCIN 40 UNITS IN LACTATED RINGERS INFUSION - SIMPLE MED: 62.5000 mL/h | INTRAVENOUS | Status: DC | PRN

## 2015-05-18 MED ORDER — MEASLES, MUMPS & RUBELLA VAC ~~LOC~~ INJ: 0.5000 mL | INJECTION | Freq: Once | SUBCUTANEOUS | Status: DC

## 2015-05-18 MED ORDER — ONDANSETRON HCL 4 MG/2ML IJ SOLN: 4.0000 mg | INTRAMUSCULAR | Status: DC | PRN

## 2015-05-18 NOTE — Lactation Note (Signed)
This note was copied from the chart of Donna Aviva SignsKeoisha Tomkiewicz. Lactation Consultation Note  Patient Name: Donna Pittman QMVHQ'IToday's Date: 05/18/2015 Reason for consult: Initial assessment;Difficult latch   Maternal Data Does the patient have breastfeeding experience prior to this delivery?: No  Feeding Feeding Type: Breast Milk Length of feed:  (few sucks with nipple shield) Baby has not stooled or voided since birth, mom only pushed 5 min. Probable full stomach with mucous and amniotic fluid, tight jaw, not opening mouth well, will suck at breast 2-3 times and stop, even with nipple shield, will continue to offer breast and then pump breast if no latch and supplement baby with EBM LATCH Score/Interventions Latch: Repeated attempts needed to sustain latch, nipple held in mouth throughout feeding, stimulation needed to elicit sucking reflex. Intervention(s): Adjust position;Assist with latch  Audible Swallowing: None Intervention(s): Hand expression Intervention(s): Skin to skin  Type of Nipple: Flat Intervention(s): Double electric pump  Comfort (Breast/Nipple): Soft / non-tender     Hold (Positioning): Full assist, staff holds infant at breast  LATCH Score: 4  Lactation Tools Discussed/Used Tools: Nipple Shields Nipple shield size: 20 Pump Review: Setup, frequency, and cleaning Initiated by:: [name], RN Date initiated:: 05/18/15   Consult Status      Donna Pittman 05/18/2015, 4:00 PM

## 2015-05-18 NOTE — Progress Notes (Signed)
Pt transferred via w/c in stable condition to rm 345.

## 2015-05-18 NOTE — Progress Notes (Signed)
Pt assisted to bathroom to void.  Shown pericare, clean gown, pad and icepack on.  Pt assisted back to bed to wait on baby.

## 2015-05-18 NOTE — Progress Notes (Signed)
PPD #0 SVD, baby boy "Masen"  S:  Reports feeling good, but exhausted              Tolerating po/ No nausea or vomiting             Bleeding is light             Pain controlled with Motrin and Percocet             Up ad lib / ambulatory / voiding QS  Newborn breast feeding - needs assistance with positioning and latch   O:               VS: BP 139/89 mmHg  Pulse 81  Temp(Src) 98.6 F (37 C) (Oral)  Resp 20  Ht 5\' 7"  (1.702 m)  Wt 104.327 kg (230 lb)  BMI 36.01 kg/m2  SpO2 100%  LMP 08/11/2014  Breastfeeding? Unknown   LABS:              Recent Labs  05/17/15 0921 05/18/15 0618  WBC 7.3 11.2*  HGB 10.5* 10.1*  PLT 340 306               Blood type: --/--/O POS (11/13 0922)  Rubella: Immune (04/19 0000)                     I&O: Intake/Output      11/13 0701 - 11/14 0700 11/14 0701 - 11/15 0700   P.O. 360    Total Intake(mL/kg) 360 (3.5)    Urine (mL/kg/hr)  800 (3.7)   Blood 200    Total Output 200 800   Net +160 -800                      Physical Exam:             Alert and oriented X3  Lungs: Clear and unlabored  Heart: regular rate and rhythm / no mumurs  Abdomen: soft, non-tender, non-distended              Fundus: firm, non-tender, U-E  Perineum: intact  Lochia: light  Extremities: no edema, no calf pain or tenderness    A: PPD # 0   Doing well - stable status  Mild ABL Anemia compounding IDA    P: Routine post partum orders  Begin Niferex daily   See lactation today  May Saline Lock IV after infusion and D/C IV if tolerating diet  Carlean JewsMeredith Sigmon, CNM

## 2015-05-18 NOTE — Progress Notes (Signed)
Pt's position changed.  SVE 8/90/-1,

## 2015-05-18 NOTE — Progress Notes (Signed)
Pt is complete and pushing well.   CNM called to room.

## 2015-05-18 NOTE — Progress Notes (Signed)
Epidural  Cath removed.  Blue tip intact and witnessed by patient.

## 2015-05-18 NOTE — Progress Notes (Signed)
S: Resting comfortably with epidural. + CTX, + LOF,  No VB O: Filed Vitals:   05/17/15 2211 05/17/15 2225 05/17/15 2301 05/17/15 2316  BP: 112/63 104/56 99/82 110/89  Pulse: 81 85  81  Temp:   98.7 F (37.1 C)   TempSrc:   Oral   Resp:   18   Height:      Weight:      SpO2:       Gen: NAD, AAOx3      Abd: FNTTP      Ext: Non-tender, Nonedmeatous    FHT:+mod var + accelerations no decelerations, Cat 1 TOCO: Q 2-21/2 mins SVE: *last exam 3/100/vtx-1   A/P:  24 y.o. yo G4P1030 at 2446w6d for IOL.    Labor:Progressing  FWB: Reassuring Cat 1 tracing.   GBS:Pos  Continue to monitor UC/FHT's   Sharee Pimplearon W Zaila Crew 12:08 AM

## 2015-05-19 MED ORDER — OXYCODONE-ACETAMINOPHEN 5-325 MG PO TABS: 1.0000 | ORAL_TABLET | ORAL | Status: DC | PRN

## 2015-05-19 MED ORDER — LANOLIN HYDROUS EX OINT: 1.0000 "application " | TOPICAL_OINTMENT | CUTANEOUS | Status: DC | PRN

## 2015-05-19 MED ORDER — IBUPROFEN 600 MG PO TABS: 600.0000 mg | ORAL_TABLET | Freq: Four times a day (QID) | ORAL | Status: DC

## 2015-05-19 NOTE — Discharge Summary (Signed)
Obstetric Discharge Summary Reason for Admission: induction of labor Prenatal Procedures: NST and ultrasound Intrapartum Procedures: spontaneous vaginal delivery Postpartum Procedures: Rubella Ig Complications-Operative and Postpartum: none HEMOGLOBIN  Date Value Ref Range Status  05/18/2015 10.1* 12.0 - 16.0 g/dL Final   HGB  Date Value Ref Range Status  06/18/2014 12.4 12.0-16.0 g/dL Final   HCT  Date Value Ref Range Status  05/18/2015 32.4* 35.0 - 47.0 % Final  06/18/2014 38.5 35.0-47.0 % Final    Physical Exam:  General: alert, cooperative and no distress Lochia: appropriate Uterine Fundus: firm Incision: none DVT Evaluation: No evidence of DVT seen on physical exam.  Discharge Diagnoses: Term Pregnancy-delivered  Discharge Information: Date: 05/19/2015 Activity: pelvic rest Diet: routine Medications: Ibuprofen and Colace Condition: stable Instructions: refer to practice specific booklet Discharge to: home Follow-up Information    Follow up with Sharee Pimplearon W Jones, CNM In 6 weeks.   Specialty:  Obstetrics and Gynecology   Why:  For postpartum visit   Contact information:   [address] [facility] [place] [address] [phone]       Follow up with Christeen DouglasBEASLEY, Penelope Fittro, MD In 1 week.   Specialty:  Obstetrics and Gynecology   Why:  breastfeeding support if desired   Contact information:   [address] [place] [address] [phone]       Newborn Data: Live born female  Birth Weight: 7 lb 4.1 oz (3290 g) APGAR: 8, 9  Home with mother.  Christeen DouglasBEASLEY, Toshiko Kemler 05/19/2015, 9:03 AM

## 2015-05-19 NOTE — Anesthesia Postprocedure Evaluation (Signed)
  Anesthesia Post-op Note  Patient: Donna Pittman  Procedure(s) Performed: * No procedures listed *  Anesthesia type:Epidural  Patient location: 345  Post pain: Pain level controlled  Post assessment: Post-op Vital signs reviewed, Patient's Cardiovascular Status Stable, Respiratory Function Stable, Patent Airway and No signs of Nausea or vomiting  Post vital signs: Reviewed and stable  Last Vitals:  Filed Vitals:   05/19/15 0441  BP: 124/74  Pulse: 79  Temp:   Resp:     Level of consciousness: awake, alert  and patient cooperative  Complications: No apparent anesthesia complications

## 2015-05-19 NOTE — Progress Notes (Signed)
Patient understands all discharge instructions and the need to make follow up appointments. Patient discharge via wheelchair with auxillary. 

## 2015-05-19 NOTE — Discharge Instructions (Signed)
Care After Vaginal Delivery °Congratulations on your new baby!! ° °Refer to this sheet in the next few weeks. These discharge instructions provide you with information on caring for yourself after delivery. Your caregiver may also give you specific instructions. Your treatment has been planned according to the most current medical practices available, but problems sometimes occur. Call your caregiver if you have any problems or questions after you go home. ° °HOME CARE INSTRUCTIONS °· Take over-the-counter or prescription medicines only as directed by your caregiver or pharmacist. °· Do not drink alcohol, especially if you are breastfeeding or taking medicine to relieve pain. °· Do not chew or smoke tobacco. °· Do not use illegal drugs. °· Continue to use good perineal care. Good perineal care includes: °¨ Wiping your perineum from front to back. °¨ Keeping your perineum clean. °· Do not use tampons or douche until your caregiver says it is okay. °· Shower, wash your hair, and take tub baths as directed by your caregiver. °· Wear a well-fitting bra that provides breast support. °· Eat healthy foods. °· Drink enough fluids to keep your urine clear or pale yellow. °· Eat high-fiber foods such as whole grain cereals and breads, brown rice, beans, and fresh fruits and vegetables every day. These foods may help prevent or relieve constipation. °· Follow your caregiver's recommendations regarding resumption of activities such as climbing stairs, driving, lifting, exercising, or traveling. Specifically, no driving for two weeks, so that you are comfortable reacting quickly in an emergency. °· Talk to your caregiver about resuming sexual activities. Resumption of sexual activities is dependent upon your risk of infection, your rate of healing, and your comfort and desire to resume sexual activity. Usually we recommend waiting about six weeks, or until your bleeding stops and you are interested in sex. °· Try to have someone  help you with your household activities and your newborn for at least a few days after you leave the hospital. Even longer is better. °· Rest as much as possible. Try to rest or take a nap when your newborn is sleeping. Sleep deprivation can be very hard after delivery. °· Increase your activities gradually. °· Keep all of your scheduled postpartum appointments. It is very important to keep your scheduled follow-up appointments. At these appointments, your caregiver will be checking to make sure that you are healing physically and emotionally. ° °SEEK MEDICAL CARE IF:  °· You are passing large clots from your vagina.  °· You have a foul smelling discharge from your vagina. °· You have trouble urinating. °· You are urinating frequently. °· You have pain when you urinate. °· You have a change in your bowel movements. °· You have increasing redness, pain, or swelling near your vaginal incision (episiotomy) or vaginal tear. °· You have pus draining from your episiotomy or vaginal tear. °· Your episiotomy or vaginal tear is separating. °· You have painful, hard, or reddened breasts. °· You have a severe headache. °· You have blurred vision or see spots. °· You feel sad or depressed. °· You have thoughts of hurting yourself or your newborn. °· You have questions about your care, the care of your newborn, or medicines. °· You are dizzy or light-headed. °· You have a rash. °· You have nausea or vomiting. °· You were breastfeeding and have not had a menstrual period within 12 weeks after you stopped breastfeeding. °· You are not breastfeeding and have not had a menstrual period by the 12th week after delivery. °· You   have a fever. ° °SEEK IMMEDIATE MEDICAL CARE IF:  °· You have persistent pain. °· You have chest pain. °· You have shortness of breath. °· You faint. °· You have leg pain. °· You have stomach pain. °· Your vaginal bleeding saturates two or more sanitary pads in 1 hour. ° °MAKE SURE YOU:  °· Understand these  instructions. °· Will get help right away if you are not doing well or get worse. °·  °Document Released: 06/17/2000 Document Revised: 11/04/2013 Document Reviewed: 02/15/2012 ° °[employer]® Patient Information ©2015 [employer]. This information is not intended to replace advice given to you by your health care provider. Make sure you discuss any questions you have with your health care provider. ° °

## 2016-01-06 ENCOUNTER — Emergency Department
Admission: EM | Admit: 2016-01-06 | Discharge: 2016-01-06 | Disposition: A | Discharge status: Home / Self Care | Payer: Managed Care, Other (non HMO) | Attending: Emergency Medicine | Admitting: Emergency Medicine

## 2016-01-06 ENCOUNTER — Encounter: Payer: Self-pay | Admitting: *Deleted

## 2016-01-06 DIAGNOSIS — Z5321 Procedure and treatment not carried out due to patient leaving prior to being seen by health care provider: Secondary | ICD-10-CM | POA: Diagnosis not present

## 2016-01-06 DIAGNOSIS — R51 Headache: Secondary | ICD-10-CM | POA: Diagnosis not present

## 2016-01-06 MED ORDER — ACETAMINOPHEN 325 MG PO TABS
ORAL_TABLET | ORAL | Status: AC
  Administered 2016-01-06: 650 mg via ORAL
  Filled 2016-01-06: qty 2

## 2016-01-06 MED ORDER — ACETAMINOPHEN 325 MG PO TABS
650.0000 mg | ORAL_TABLET | Freq: Once | ORAL | Status: AC | PRN
  Administered 2016-01-06: 650 mg via ORAL

## 2016-01-06 NOTE — ED Notes (Signed)
Called for the second attempt from the lobby , no response .

## 2016-01-06 NOTE — ED Notes (Signed)
Called from the lobby for re-eval of vital signs , no response.

## 2016-01-06 NOTE — ED Notes (Signed)
States chills, body aches, and headache since yesterday, states nausea, states "I feel like I have been hit by a bus"

## 2017-05-16 ENCOUNTER — Encounter: Payer: Self-pay | Admitting: Certified Nurse Midwife

## 2017-05-16 ENCOUNTER — Ambulatory Visit (INDEPENDENT_AMBULATORY_CARE_PROVIDER_SITE_OTHER): Payer: 59 | Admitting: Certified Nurse Midwife

## 2017-05-16 VITALS — BP 122/80 | HR 91 | Ht 67.0 in | Wt 223.0 lb

## 2017-05-16 DIAGNOSIS — M545 Low back pain, unspecified: Secondary | ICD-10-CM

## 2017-05-16 DIAGNOSIS — Z113 Encounter for screening for infections with a predominantly sexual mode of transmission: Secondary | ICD-10-CM | POA: Diagnosis not present

## 2017-05-16 DIAGNOSIS — Z124 Encounter for screening for malignant neoplasm of cervix: Secondary | ICD-10-CM | POA: Diagnosis not present

## 2017-05-16 DIAGNOSIS — R350 Frequency of micturition: Secondary | ICD-10-CM | POA: Diagnosis not present

## 2017-05-16 DIAGNOSIS — Z13220 Encounter for screening for lipoid disorders: Secondary | ICD-10-CM | POA: Diagnosis not present

## 2017-05-16 DIAGNOSIS — E669 Obesity, unspecified: Secondary | ICD-10-CM

## 2017-05-16 DIAGNOSIS — Z23 Encounter for immunization: Secondary | ICD-10-CM

## 2017-05-16 DIAGNOSIS — Z131 Encounter for screening for diabetes mellitus: Secondary | ICD-10-CM | POA: Diagnosis not present

## 2017-05-16 DIAGNOSIS — M546 Pain in thoracic spine: Secondary | ICD-10-CM | POA: Diagnosis not present

## 2017-05-16 LAB — POCT URINALYSIS DIPSTICK
Blood, UA: NEGATIVE
GLUCOSE UA: NEGATIVE
Ketones, UA: 0.5
NITRITE UA: NEGATIVE
Spec Grav, UA: 1.025 (ref 1.010–1.025)
Urobilinogen, UA: 0.2 E.U./dL
pH, UA: 5 (ref 5.0–8.0)

## 2017-05-16 NOTE — Patient Instructions (Signed)

## 2017-05-16 NOTE — Progress Notes (Signed)
Gynecology Annual Exam  PCP: Patient, No Pcp Per  Chief Complaint:  Chief Complaint  Patient presents with  . Gynecologic Exam    History of Present Illness: Donna Pittman is a 26 y.o. [phone] who  presents for NP  gyn exam. The patient complains of 2 month history of left flank pain, bad odor to urine, and urinary frequency. She denies hematuria or dysuria.  She reports her urine is dark in color.  Her menses are absent due to her Nexplanon which was inserted postpartum 06/30/2015.Her last annual [facility] and her last Pap smear was 05/19/2014 and was normal. No history of abnormal Pap smears.  The patient is sexually active.   Her past medical history is remarkable for obesity  The patient does perform self breast exams. Her last mammogram was NA  There is no family history of breast cancer..   There is no family history of ovarian cancer.   The patient denies smoking.  She denies drinking.   She denies illegal drug use.  The patient does not exercise. Current BMI 34.92 kg/m2  The patient denies current symptoms of depression.    Review of Systems: Review of Systems  Constitutional: Negative for chills, fever and weight loss.  HENT: Negative for congestion, sinus pain and sore throat.   Eyes: Negative for blurred vision and pain.  Respiratory: Negative for hemoptysis, shortness of breath and wheezing.   Cardiovascular: Negative for chest pain, palpitations and leg swelling.  Gastrointestinal: Negative for abdominal pain, blood in stool, diarrhea, heartburn, nausea and vomiting.  Genitourinary: Positive for frequency. Negative for dysuria, hematuria and urgency.  Musculoskeletal: Positive for back pain. Negative for joint pain and myalgias.  Skin: Negative for itching and rash.  Neurological: Negative for dizziness, tingling and headaches.  Endo/Heme/Allergies: Negative for environmental allergies and polydipsia. Does not bruise/bleed easily.       Negative for hirsutism   Psychiatric/Behavioral: Negative for depression. The patient is not nervous/anxious and does not have insomnia.     Past Medical History:  Past Medical History:  Diagnosis Date  . Obesity (BMI 30.0-34.9)     Past Surgical History:  Past Surgical History:  Procedure Laterality Date  . DILATION AND CURETTAGE OF UTERUS  2015   missed abortion    Family History:  Family History  Problem Relation Age of Onset  . Diabetes Maternal Grandmother   . Hypertension Maternal Grandmother   . Stroke Maternal Grandmother   . Diabetes Paternal Grandmother   . Hypertension Paternal Grandmother   . Leukemia Paternal Grandmother     Social History:  Social History   Socioeconomic History  . Marital status: Single    Spouse name: Not on file  . Number of children: 1  . Years of education: Not on file  . Highest education level: Not on file  Social Needs  . Financial resource strain: Not on file  . Food insecurity - worry: Not on file  . Food insecurity - inability: Not on file  . Transportation needs - medical: Not on file  . Transportation needs - non-medical: Not on file  Occupational History  . 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 Smoking status: Never Smoker  . Smokeless tobacco: Never Used  Substance and Sexual Activity  . Alcohol use: No  . Drug use: No  . Sexual activity: Yes    Partners: Male    Birth control/protection: Implant  Other Topics Concern  . Not  on file  Social History Narrative  . Not on file    Allergies:  No Known Allergies  Medications: Current Outpatient Medications:  .  etonogestrel (IMPLANON) 68 MG IMPL implant, Inject into the skin., Disp: , Rfl:  Physical Exam Vitals: BP 122/80   Pulse 91   Ht 5\' 7"  (1.702 m)   Wt 223 lb (101.2 kg)   Breastfeeding? No   BMI 34.93 kg/m   General:BF in  NAD HEENT: normocephalic, anicteric Neck: no thyroid enlargement, no palpable nodules, no cervical lymphadenopathy    Pulmonary: No increased work of breathing, CTAB Cardiovascular: RRR, without murmur  Breast: Breast symmetrical, no tenderness, no palpable nodules or masses, no skin or nipple retraction present, no nipple discharge.  No axillary, infraclavicular or supraclavicular lymphadenopathy. Back:No CVAT Abdomen: Soft, non-tender, non-distended.  Umbilicus without lesions.  No hepatomegaly or masses palpable. No evidence of hernia. Genitourinary:  External: Normal external female genitalia.  Normal urethral meatus, normal Bartholin's and Skene's glands.    Vagina: Normal vaginal mucosa, no evidence of prolapse.    Cervix: Grossly normal in appearance, no bleeding, non-tender  Uterus: Anteverted, normal size, shape, and consistency, mobile, and non-tender  Adnexa: No adnexal masses, non-tender  Rectal: deferred  Lymphatic: no evidence of inguinal lymphadenopathy Extremities: no edema, erythema, or tenderness Neurologic: Grossly intact Psychiatric: mood appropriate, affect full  Urine dipstick: sp grav 1.025, +1 urobilinogen, trace leukocytes  Assessment: 26 y.o. [id number] annual gyn exam Urinary frequency/ left flank pain  R/O UTI Obesity Plan:    1) Breast cancer screening - recommend monthly self breast exam.   2) STI screening was offered and accepted. GC/ Chlamydia/ RPR/ HIV ordered  3) Cervical cancer screening - Pap was done.   4) Contraception - Nexplanon expires in 1 year  5) Routine healthcare maintenance including cholesterol and diabetes screening ordered today. Urine culture also ordered. Discussed weight loss by decreasing caloric intake and exercising regularly.  6) RTO 1 year for annual and prn  Farrel Connersolleen Brealynn Contino, CNM

## 2017-05-17 LAB — LIPID PANEL WITH LDL/HDL RATIO
CHOLESTEROL TOTAL: 170 mg/dL (ref 100–199)
HDL: 39 mg/dL — AB (ref >39)
LDL Calculated: 105 mg/dL — ABNORMAL HIGH (ref 0–99)
LDl/HDL Ratio: 2.7 ratio (ref 0.0–3.2)
Triglycerides: 132 mg/dL (ref 0–149)
VLDL CHOLESTEROL CAL: 26 mg/dL (ref 5–40)

## 2017-05-17 LAB — HIV ANTIBODY (ROUTINE TESTING W REFLEX): HIV Screen 4th Generation wRfx: NONREACTIVE

## 2017-05-17 LAB — TSH: TSH: 0.996 u[IU]/mL (ref 0.450–4.500)

## 2017-05-17 LAB — RPR: RPR Ser Ql: NONREACTIVE

## 2017-05-17 LAB — HGB A1C W/O EAG: Hgb A1c MFr Bld: 5.6 % (ref 4.8–5.6)

## 2017-05-18 ENCOUNTER — Telehealth: Payer: Self-pay | Admitting: Certified Nurse Midwife

## 2017-05-18 ENCOUNTER — Encounter: Payer: Self-pay | Admitting: Certified Nurse Midwife

## 2017-05-18 LAB — URINE CULTURE: ORGANISM ID, BACTERIA: NO GROWTH

## 2017-05-18 NOTE — Telephone Encounter (Signed)
I called patient and let her know that her urine culture was negative. Donna Pittman

## 2017-05-18 NOTE — Telephone Encounter (Signed)
Pt is calling about her prescription. Pt states she went to her pharmacy and [facility] . Please advise

## 2017-05-23 LAB — PAP IG, CT-NG, RFX HPV ALL
CHLAMYDIA, NUC. ACID AMP: POSITIVE — AB
GONOCOCCUS BY NUCLEIC ACID AMP: NEGATIVE
PAP Smear Comment: 0

## 2017-05-25 ENCOUNTER — Encounter: Payer: Self-pay | Admitting: Emergency Medicine

## 2017-05-25 ENCOUNTER — Emergency Department
Admission: EM | Admit: 2017-05-25 | Discharge: 2017-05-25 | Disposition: A | Discharge status: Home / Self Care | Payer: 59 | Attending: Emergency Medicine | Admitting: Emergency Medicine

## 2017-05-25 ENCOUNTER — Other Ambulatory Visit: Payer: Self-pay

## 2017-05-25 ENCOUNTER — Emergency Department: Payer: 59

## 2017-05-25 DIAGNOSIS — R1010 Upper abdominal pain, unspecified: Secondary | ICD-10-CM | POA: Diagnosis not present

## 2017-05-25 DIAGNOSIS — R7989 Other specified abnormal findings of blood chemistry: Secondary | ICD-10-CM

## 2017-05-25 DIAGNOSIS — Z79899 Other long term (current) drug therapy: Secondary | ICD-10-CM | POA: Insufficient documentation

## 2017-05-25 DIAGNOSIS — R197 Diarrhea, unspecified: Secondary | ICD-10-CM | POA: Insufficient documentation

## 2017-05-25 DIAGNOSIS — R112 Nausea with vomiting, unspecified: Secondary | ICD-10-CM

## 2017-05-25 DIAGNOSIS — R945 Abnormal results of liver function studies: Secondary | ICD-10-CM | POA: Insufficient documentation

## 2017-05-25 LAB — CBC WITH DIFFERENTIAL/PLATELET
BASOS ABS: 0 10*3/uL (ref 0–0.1)
Basophils Relative: 0 %
EOS PCT: 1 %
Eosinophils Absolute: 0.1 10*3/uL (ref 0–0.7)
HCT: 40.1 % (ref 35.0–47.0)
Hemoglobin: 13.3 g/dL (ref 12.0–16.0)
LYMPHS PCT: 7 %
Lymphs Abs: 0.8 10*3/uL — ABNORMAL LOW (ref 1.0–3.6)
MCH: 27.6 pg (ref 26.0–34.0)
MCHC: 33.2 g/dL (ref 32.0–36.0)
MCV: 83 fL (ref 80.0–100.0)
MONO ABS: 0.8 10*3/uL (ref 0.2–0.9)
Monocytes Relative: 7 %
Neutro Abs: 9.8 10*3/uL — ABNORMAL HIGH (ref 1.4–6.5)
Neutrophils Relative %: 85 %
PLATELETS: 305 10*3/uL (ref 150–440)
RBC: 4.83 MIL/uL (ref 3.80–5.20)
RDW: 13.6 % (ref 11.5–14.5)
WBC: 11.5 10*3/uL — ABNORMAL HIGH (ref 3.6–11.0)

## 2017-05-25 LAB — COMPREHENSIVE METABOLIC PANEL
ALT: 114 U/L — ABNORMAL HIGH (ref 14–54)
ANION GAP: 8 (ref 5–15)
AST: 237 U/L — AB (ref 15–41)
Albumin: 3.7 g/dL (ref 3.5–5.0)
Alkaline Phosphatase: 83 U/L (ref 38–126)
BUN: 12 mg/dL (ref 6–20)
CHLORIDE: 105 mmol/L (ref 101–111)
CO2: 23 mmol/L (ref 22–32)
Calcium: 8.8 mg/dL — ABNORMAL LOW (ref 8.9–10.3)
Creatinine, Ser: 0.87 mg/dL (ref 0.44–1.00)
Glucose, Bld: 130 mg/dL — ABNORMAL HIGH (ref 65–99)
POTASSIUM: 3.8 mmol/L (ref 3.5–5.1)
Sodium: 136 mmol/L (ref 135–145)
Total Bilirubin: 0.7 mg/dL (ref 0.3–1.2)
Total Protein: 7.8 g/dL (ref 6.5–8.1)

## 2017-05-25 LAB — LACTIC ACID, PLASMA
LACTIC ACID, VENOUS: 2.1 mmol/L — AB (ref 0.5–1.9)
Lactic Acid, Venous: 0.7 mmol/L (ref 0.5–1.9)

## 2017-05-25 LAB — URINALYSIS, ROUTINE W REFLEX MICROSCOPIC
BILIRUBIN URINE: NEGATIVE
Glucose, UA: NEGATIVE mg/dL
Hgb urine dipstick: NEGATIVE
KETONES UR: NEGATIVE mg/dL
Leukocytes, UA: NEGATIVE
NITRITE: NEGATIVE
Protein, ur: NEGATIVE mg/dL
Specific Gravity, Urine: 1.041 — ABNORMAL HIGH (ref 1.005–1.030)
pH: 5 (ref 5.0–8.0)

## 2017-05-25 LAB — LIPASE, BLOOD: LIPASE: 21 U/L (ref 11–51)

## 2017-05-25 MED ORDER — IBUPROFEN 600 MG PO TABS
600.0000 mg | ORAL_TABLET | Freq: Once | ORAL | Status: AC
Start: 2017-05-25 — End: 2017-05-25
  Administered 2017-05-25: 600 mg via ORAL
  Filled 2017-05-25: qty 1

## 2017-05-25 MED ORDER — SODIUM CHLORIDE 0.9 % IV BOLUS (SEPSIS)
1000.0000 mL | Freq: Once | INTRAVENOUS | Status: AC
  Administered 2017-05-25: 1000 mL via INTRAVENOUS

## 2017-05-25 MED ORDER — ONDANSETRON 4 MG PO TBDP: ORAL_TABLET | ORAL | 0 refills | Status: DC

## 2017-05-25 MED ORDER — SODIUM CHLORIDE 0.9 % IV BOLUS (SEPSIS)
1000.0000 mL | INTRAVENOUS | Status: AC
  Administered 2017-05-25: 1000 mL via INTRAVENOUS

## 2017-05-25 MED ORDER — IOPAMIDOL (ISOVUE-300) INJECTION 61%
100.0000 mL | Freq: Once | INTRAVENOUS | Status: AC | PRN
  Administered 2017-05-25: 100 mL via INTRAVENOUS

## 2017-05-25 NOTE — ED Notes (Signed)
Lab called for a critical high lactic acid of 2.1 Dr. York CeriseForbach was notified.

## 2017-05-25 NOTE — Discharge Instructions (Addendum)
You have been seen in the Emergency Department (ED) for abdominal pain, diarrhea, and some nauses and vomiting.  Your evaluation did not identify a clear cause of your symptoms but was generally reassuring.  You do have a slightly elevated heart rate and temperature (although not quite a true fever), and your lab work shows a slight elevation of your liver function tests.  These results are non-specific, especially given that your ultrasound and abdominal CT scan were both normal with no evidence of inflammation or infection, but it would be a good idea to let your primary care doctor know about these results.  Please follow up at the next available opportunity with your regular doctor regarding today?s emergent visit and the symptoms that are bothering you.  Return to the ED if your abdominal pain worsens or fails to improve, you develop bloody vomiting, bloody diarrhea, you are unable to tolerate fluids due to vomiting, fever greater than 101, or other symptoms that concern you.

## 2017-05-25 NOTE — ED Notes (Signed)
Pt went to ultrasound.

## 2017-05-25 NOTE — ED Notes (Signed)
Resting, IVF completing.

## 2017-05-25 NOTE — ED Notes (Signed)
Pt returned from CT °

## 2017-05-25 NOTE — ED Triage Notes (Addendum)
Patient ambulatory to triage with steady gait, without difficulty or distress noted; pt reports mid upper abd pain radiating into back upon awakening 2hrs PTA; diarrhea x 2 day; denies hx of same
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
## 2017-05-29 ENCOUNTER — Telehealth: Payer: Self-pay | Admitting: Certified Nurse Midwife

## 2017-05-29 ENCOUNTER — Encounter: Payer: Self-pay | Admitting: Certified Nurse Midwife

## 2017-05-29 DIAGNOSIS — A749 Chlamydial infection, unspecified: Secondary | ICD-10-CM | POA: Insufficient documentation

## 2017-05-29 MED ORDER — AZITHROMYCIN 500 MG PO TABS: ORAL_TABLET | ORAL | 0 refills | Status: DC

## 2017-05-29 NOTE — Telephone Encounter (Signed)
Lab results reviewed with patient. Has a positive Chlamydia NAAT with PAp smear. RX for Azithromycin !GM sent to pharmacy. Patient aware that partner also needs to be treated. Other labs OK.

## 2018-01-17 ENCOUNTER — Encounter: Payer: Self-pay | Admitting: Certified Nurse Midwife

## 2018-01-18 ENCOUNTER — Other Ambulatory Visit (HOSPITAL_COMMUNITY)
Admission: RE | Admit: 2018-01-18 | Discharge: 2018-01-18 | Disposition: A | Discharge status: Home / Self Care | Payer: 59 | Source: Ambulatory Visit | Attending: Maternal Newborn | Admitting: Maternal Newborn

## 2018-01-18 ENCOUNTER — Ambulatory Visit (INDEPENDENT_AMBULATORY_CARE_PROVIDER_SITE_OTHER): Payer: 59 | Admitting: Maternal Newborn

## 2018-01-18 ENCOUNTER — Encounter: Payer: Self-pay | Admitting: Maternal Newborn

## 2018-01-18 VITALS — BP 120/70 | HR 72 | Ht 67.0 in | Wt 228.0 lb

## 2018-01-18 DIAGNOSIS — R8290 Unspecified abnormal findings in urine: Secondary | ICD-10-CM

## 2018-01-18 DIAGNOSIS — R300 Dysuria: Secondary | ICD-10-CM

## 2018-01-18 DIAGNOSIS — N898 Other specified noninflammatory disorders of vagina: Secondary | ICD-10-CM

## 2018-01-18 LAB — POCT URINALYSIS DIPSTICK
Blood, UA: NEGATIVE
GLUCOSE UA: NEGATIVE
Ketones, UA: NEGATIVE
Leukocytes, UA: NEGATIVE
Nitrite, UA: NEGATIVE
PH UA: 5 (ref 5.0–8.0)
Protein, UA: POSITIVE — AB
UROBILINOGEN UA: NEGATIVE U/dL — AB

## 2018-01-18 MED ORDER — CEPHALEXIN 500 MG PO CAPS
500.0000 mg | ORAL_CAPSULE | Freq: Four times a day (QID) | ORAL | 0 refills | Status: DC
Start: 2018-01-18 — End: 2019-02-06

## 2018-01-18 NOTE — Progress Notes (Addendum)
Obstetrics & Gynecology Office Visit   Chief Complaint:  Chief Complaint  Patient presents with  . Gynecologic Exam    d/c, odor, knots under arms in the last month or so; urine is strong and dark    History of Present Illness: Patient presents today with complaints of vaginal discharge and vaginal odor for the past week or so. She has not tried any over the counter medications. No vaginal itching or irritation. She does feel some pain with intercourse. She also has urine that looks dark, and she is concerned that she has a urinary tract infection. She does not have burning when she voids but does have some discomfort. She reports increased frequency of urination.  She also complains of having swollen areas under her arms. These have resolved with the application of warm compresses, but she is worried because she has had them several times in the past month.   Review of Systems: Review of systems negative unless otherwise noted in HPI  Past Medical History:  Past Medical History:  Diagnosis Date  . Obesity (BMI 30.0-34.9)     Past Surgical History:  Past Surgical History:  Procedure Laterality Date  . DILATION AND CURETTAGE OF UTERUS  2015   missed abortion    Gynecologic History: No LMP recorded (lmp unknown). Patient has had an implant.  Obstetric History: [id number]  Family History:  Family History  Problem Relation Age of Onset  . Diabetes Maternal Grandmother   . Hypertension Maternal Grandmother   . Stroke Maternal Grandmother   . Diabetes Paternal Grandmother   . Hypertension Paternal Grandmother   . Leukemia Paternal Grandmother     Social History:  Social History   Socioeconomic History  . Marital status: Single    Spouse name: Not on file  . Number of children: 1  . Years of education: 4715  . Highest education level: Not on file  Occupational History  . Occupation: ER claims  Social Needs  . Financial resource strain: Not on file  . Food insecurity:      Worry: Not on file    Inability: Not on file  . Transportation needs:    Medical: Not on file    Non-medical: Not on file  Tobacco Use  . Smoking status: Never Smoker  . Smokeless tobacco: Never Used  Substance and Sexual Activity  . Alcohol use: No  . Drug use: No  . Sexual activity: Yes    Partners: Male    Birth control/protection: Implant  Lifestyle  . Physical activity:    Days per week: 0 days    Minutes per session: 0 min  . Stress: Not at all  Relationships  . Social connections:    Talks on phone: More than three times a week    Gets together: Three times a week    Attends religious service: More than 4 times per year    Active member of club or organization: No    Attends meetings of clubs or organizations: Never    Relationship status: Never married  . Intimate partner violence:    Fear of current or ex partner: No    Emotionally abused: No    Physically abused: No    Forced sexual activity: No  Other Topics Concern  . Not on file  Social History Narrative  . Not on file    Allergies:  No Known Allergies  Medications: Prior to Admission medications   Medication Sig Start Date End Date Taking?  Authorizing Provider  azithromycin (ZITHROMAX) 500 MG tablet Take two tablets (1000 mgm) x1 dose; may repeat if necessary 05/29/17   Farrel Conners, CNM  etonogestrel (IMPLANON) 68 MG IMPL implant Inject into the skin.    [provider]  ondansetron (ZOFRAN ODT) 4 MG disintegrating tablet Allow 1-2 tablets to dissolve in your mouth every 8 hours as needed for nausea/vomiting 05/25/17   Loleta Rose, MD    Physical Exam Vitals:  Vitals:   01/18/18 0941  BP: 120/70  Pulse: 72   No LMP recorded (lmp unknown). Patient has had an implant.  General: NAD HEENT: normocephalic, anicteric Thyroid: no enlargement, no palpable nodules Pulmonary: No increased work of breathing Abdomen: No CVAT Genitourinary:  External: Normal external female  genitalia.  Normal  urethral meatus, normal Bartholin's and Skene's glands.    Vagina: Normal vaginal mucosa, no evidence of prolapse,  small amount of white discharge present.    Cervix: Not visualized  Uterus: Non-enlarged, mobile, normal contour.  No CMT  Adnexa: ovaries non-enlarged, no adnexal masses  Rectal: deferred  Lymphatic: no evidence of inguinal lymphadenopathy Extremities: no edema, erythema, or tenderness Neurologic: Grossly intact Psychiatric: mood appropriate, affect full  Assessment: 27 y.o. [id number] with possible urinary tract infection, possible vaginal infection.  Plan: Problem List Items Addressed This Visit    None    Visit Diagnoses    Vaginal odor    -  Primary   Vaginal discharge         1) Will treat empirically for UTI as she has some discomfort. Urine culture pending.  2) Swab sent for GC, trichomonas, yeast, and BV due to odor and discharge.  3) Discussed swelling under arm. Patient aware that shaving may cause such local irritation. OK to use warm compresses but should be seen with recurrence with any signs of infection, such as fever or purulent discharge.  Marcelyn Bruins, CNM 01/18/2018  9:50 AM

## 2018-01-20 LAB — URINE CULTURE

## 2018-01-22 LAB — CERVICOVAGINAL ANCILLARY ONLY
Bacterial vaginitis: NEGATIVE
CANDIDA VAGINITIS: POSITIVE — AB
Chlamydia: NEGATIVE
NEISSERIA GONORRHEA: NEGATIVE
Trichomonas: NEGATIVE

## 2018-01-23 ENCOUNTER — Other Ambulatory Visit: Payer: Self-pay | Admitting: Maternal Newborn

## 2018-01-23 ENCOUNTER — Encounter: Payer: Self-pay | Admitting: Maternal Newborn

## 2018-01-23 DIAGNOSIS — B373 Candidiasis of vulva and vagina: Secondary | ICD-10-CM

## 2018-01-23 DIAGNOSIS — B3731 Acute candidiasis of vulva and vagina: Secondary | ICD-10-CM

## 2018-01-23 MED ORDER — FLUCONAZOLE 150 MG PO TABS
150.0000 mg | ORAL_TABLET | Freq: Once | ORAL | 0 refills | Status: AC
Start: 2018-01-23 — End: 2018-01-23

## 2018-01-23 NOTE — Progress Notes (Signed)
Sent Rx for Diflucan for candida; patient is aware.

## 2018-01-24 ENCOUNTER — Encounter: Payer: Self-pay | Admitting: Maternal Newborn

## 2018-06-05 ENCOUNTER — Encounter: Payer: Self-pay | Admitting: Maternal Newborn

## 2018-06-05 ENCOUNTER — Other Ambulatory Visit (HOSPITAL_COMMUNITY)
Admission: RE | Admit: 2018-06-05 | Discharge: 2018-06-05 | Disposition: A | Discharge status: Home / Self Care | Payer: 59 | Source: Ambulatory Visit | Attending: Maternal Newborn | Admitting: Maternal Newborn

## 2018-06-05 ENCOUNTER — Telehealth: Payer: Self-pay | Admitting: Maternal Newborn

## 2018-06-05 ENCOUNTER — Ambulatory Visit (INDEPENDENT_AMBULATORY_CARE_PROVIDER_SITE_OTHER): Payer: 59 | Admitting: Maternal Newborn

## 2018-06-05 VITALS — BP 120/80 | Ht 67.0 in | Wt 231.0 lb

## 2018-06-05 DIAGNOSIS — Z01419 Encounter for gynecological examination (general) (routine) without abnormal findings: Secondary | ICD-10-CM

## 2018-06-05 DIAGNOSIS — Z124 Encounter for screening for malignant neoplasm of cervix: Secondary | ICD-10-CM | POA: Insufficient documentation

## 2018-06-05 DIAGNOSIS — Z113 Encounter for screening for infections with a predominantly sexual mode of transmission: Secondary | ICD-10-CM

## 2018-06-05 DIAGNOSIS — Z23 Encounter for immunization: Secondary | ICD-10-CM | POA: Diagnosis not present

## 2018-06-05 NOTE — Progress Notes (Signed)
Gynecology Annual Exam  PCP: Patient, No Pcp Per  Chief Complaint:  Chief Complaint  Patient presents with  . Gynecologic Exam    History of Present Illness: Patient is a 27 y.o. [id number] presenting for an annual exam.   LMP: Patient's last menstrual period was 06/05/2018. Normally has no cycle with Nexplanon, but has been having spotting recently.  The patient is sexually active. She currently uses Nexplanon for contraception.  The patient does perform self breast exams.  There is no notable family history of breast or ovarian cancer in her family.  The patient does not have current symptoms of depression.    Review of Systems  Constitutional: Negative.   HENT: Negative.   Respiratory: Negative for cough, shortness of breath and wheezing.   Cardiovascular: Negative for chest pain and palpitations.  Gastrointestinal: Negative for abdominal pain.  Genitourinary: Negative.   Musculoskeletal: Negative.   Skin:       Recurrent boils in underarm area and recently under breasts  Neurological: Negative.   Endo/Heme/Allergies: Negative.   Psychiatric/Behavioral: Negative.   All other systems reviewed and are negative.   Past Medical History:  Past Medical History:  Diagnosis Date  . Obesity (BMI 30.0-34.9)     Past Surgical History:  Past Surgical History:  Procedure Laterality Date  . DILATION AND CURETTAGE OF UTERUS  2015   missed abortion    Gynecologic History:  Patient's last menstrual period was 06/05/2018. Contraception: Nexplanon Last Pap: 05/16/2017.  Results were: NILM   Obstetric History: [id number]  Family History:  Family History  Problem Relation Age of Onset  . Diabetes Maternal Grandmother   . Hypertension Maternal Grandmother   . Stroke Maternal Grandmother   . Diabetes Paternal Grandmother   . Hypertension Paternal Grandmother   . Leukemia Paternal Grandmother     Social History:  Social History   Socioeconomic History  . Marital  status: Single    Spouse name: Not on file  . Number of children: 1  . Years of education: 58  . Highest education level: Not on file  Occupational History  . Occupation: ER claims  Social Needs  . Financial resource strain: Not on file  . Food insecurity:    Worry: Not on file    Inability: Not on file  . Transportation needs:    Medical: Not on file    Non-medical: Not on file  Tobacco Use  . Smoking status: Never Smoker  . Smokeless tobacco: Never Used  Substance and Sexual Activity  . Alcohol use: No  . Drug use: No  . Sexual activity: Yes    Partners: Male    Birth control/protection: Implant  Lifestyle  . Physical activity:    Days per week: 0 days    Minutes per session: 0 min  . Stress: Not at all  Relationships  . Social connections:    Talks on phone: More than three times a week    Gets together: Three times a week    Attends religious service: More than 4 times per year    Active member of club or organization: No    Attends meetings of clubs or organizations: Never    Relationship status: Never married  . Intimate partner violence:    Fear of current or ex partner: No    Emotionally abused: No    Physically abused: No    Forced sexual activity: No  Other Topics Concern  . Not on file  Social History Narrative  .  Not on file    Allergies:  No Known Allergies  Medications: Prior to Admission medications   Medication Sig Start Date End Date Taking? Authorizing Provider  etonogestrel (IMPLANON) 68 MG IMPL implant Inject into the skin.   Yes [provider]  cephALEXin (KEFLEX) 500 MG capsule Take 1 capsule (500 mg total) by mouth 4 (four) times daily. Patient not taking: Reported on 06/05/2018 01/18/18   Oswaldo ConroySchmid, Yaneth Fairbairn Y, CNM    Physical Exam Vitals: Blood pressure 120/80, height 5\' 7"  (1.702 m), weight 231 lb (104.8 kg), last menstrual period 06/05/2018.  General: NAD HEENT: normocephalic, anicteric Thyroid: no enlargement, no palpable  nodules Pulmonary: No increased work of breathing, CTAB Cardiovascular: RRR, no murmurs, rubs or gallops Breast: Breasts symmetrical, no tenderness, no palpable nodules or masses, no skin or nipple retraction present, no nipple discharge.  No axillary or supraclavicular lymphadenopathy. Abdomen: Soft, non-tender, non-distended.  Umbilicus without lesions.  No hepatomegaly, splenomegaly or masses palpable. No evidence of hernia  Genitourinary:  External: Normal external female genitalia.  Normal urethral  meatus, normal Bartholin's and Skene's glands.    Vagina: Normal vaginal mucosa, no evidence of prolapse.    Cervix: Grossly normal in appearance, no bleeding  Uterus: Non-enlarged, mobile, normal contour.  No CMT  Adnexa: ovaries non-enlarged, no adnexal masses  Rectal: deferred  Lymphatic: no evidence of inguinal lymphadenopathy Extremities: no edema, erythema, or tenderness Neurologic: Grossly intact Psychiatric: mood appropriate, affect full  Assessment: 27 y.o. [id number] routine annual exam.  Plan: Problem List Items Addressed This Visit    None    Visit Diagnoses    Women's annual routine gynecological examination    -  Primary   Relevant Orders   HEP, RPR, HIV Panel   Cytology - PAP   Screen for STD (sexually transmitted disease)       Relevant Orders   HEP, RPR, HIV Panel   Cytology - PAP   Pap smear for cervical cancer screening       Relevant Orders   Cytology - PAP   Need for immunization against influenza       Relevant Orders   Flu Vaccine QUAD 36+ mos IM (Completed)     1) STI screening was offered and accepted.  2) ASCCP guidelines and rationale discussed.  Patient opts for yearly screening interval.  3) Contraception - Education given regarding options for contraception. She desires to continue with Nexplanon at present and will make an appointment for removal and reinsertion; device expiration at the end of December.  4) Routine healthcare maintenance  including cholesterol, diabetes screening: declines today. Done last year, consider at next annual.   5) She has had recurrent boils under her armpits and recently one under her right breast. She is able to express pus from these, and then they heal after a time. Discussed that this may be caused by Staph bacterial infection and a culture can be sent upon recurrence if a sample can be collected from the wound.  6) Follow up 1 year for routine annual exam.  Marcelyn BruinsJacelyn Shellye Zandi, CNM 06/05/2018  2:53 PM

## 2018-06-05 NOTE — Telephone Encounter (Signed)
Patient coming in on 07/25/18 at 1:30 with JS for nexplanon removal and reinsertion

## 2018-06-06 LAB — HEP, RPR, HIV PANEL
HEP B S AG: NEGATIVE
HIV SCREEN 4TH GENERATION: NONREACTIVE
RPR: NONREACTIVE

## 2018-06-08 LAB — CYTOLOGY - PAP
Chlamydia: NEGATIVE
DIAGNOSIS: NEGATIVE
Neisseria Gonorrhea: NEGATIVE
Trichomonas: NEGATIVE

## 2018-06-13 NOTE — Telephone Encounter (Signed)
Noted. Will order to arrive by apt date/time. 

## 2018-07-25 ENCOUNTER — Encounter: Payer: Self-pay | Admitting: Maternal Newborn

## 2018-07-25 ENCOUNTER — Ambulatory Visit: Payer: 59 | Admitting: Maternal Newborn

## 2018-07-25 VITALS — BP 118/76 | Ht 67.0 in | Wt 232.0 lb

## 2018-07-25 DIAGNOSIS — Z3049 Encounter for surveillance of other contraceptives: Secondary | ICD-10-CM | POA: Diagnosis not present

## 2018-07-25 DIAGNOSIS — Z30017 Encounter for initial prescription of implantable subdermal contraceptive: Secondary | ICD-10-CM

## 2018-07-25 DIAGNOSIS — Z3046 Encounter for surveillance of implantable subdermal contraceptive: Secondary | ICD-10-CM

## 2018-07-25 NOTE — Progress Notes (Signed)
  GYNECOLOGY PROCEDURE NOTE  Patient is a 28 y.o. [id number] presenting for Nexplanon removal and re-insertion as her desired means of contraception.  She provided informed consent, signed copy in the chart, time out was performed.  No LMP recorded. Patient has had an implant.  She understands that Nexplanon is a progesterone only therapy, and that patients often patients have irregular and unpredictable vaginal bleeding or amenorrhea. She understands that other side effects are possible related to systemic progesterone, including but not limited to, headaches, breast tenderness, nausea, and irritability.   Procedure: Patient placed in dorsal supine with left arm above head, elbow flexed at 90 degrees, arm resting on examination table. Nexplanon identified without problems. Site marked.  Betadine scrub performed.  1 ml of 1% lidocaine injected under device without problems.  Sterile gloves applied.  Small 0.5cm incision made at distal tip of device with 11 blade scalpel.  Nexplanon brought to incision and grasped with a small kelly clamp.  Nexplanon was removed intact.  Pressure applied to incision.  Hemostasis obtained.  Additional 1 ml of lidocaine was injected along the insertion site. The site was cleaned again and new sterile gloves applied.  The Nexplanon device was confirmed in the needle before inserting the full length of the needle into the pre-existing site, tenting up the skin as the needle was advanced.  The rod was then deployed by pulling back the slider per the manufacturer's recommendation.  The implant was palpable by the clinician as well as the patient.  The insertion site covered dressed with a large band aid before applying a Kerlex bandage pressure dressing. Minimal blood loss was noted during the procedure.  The patient tolerated the procedure well.   She was instructed to wear the bandage for 24 hours, call with any signs of infection.  She was given the Nexplanon card and instructed  to have the rod removed in 3 years.  Marcelyn Bruins, CNM 07/25/2018

## 2018-10-27 ENCOUNTER — Emergency Department: Payer: 59

## 2018-10-27 ENCOUNTER — Other Ambulatory Visit: Payer: Self-pay

## 2018-10-27 ENCOUNTER — Emergency Department
Admission: EM | Admit: 2018-10-27 | Discharge: 2018-10-27 | Disposition: A | Discharge status: Home / Self Care | Payer: 59 | Attending: Emergency Medicine | Admitting: Emergency Medicine

## 2018-10-27 DIAGNOSIS — X501XXA Overexertion from prolonged static or awkward postures, initial encounter: Secondary | ICD-10-CM | POA: Insufficient documentation

## 2018-10-27 DIAGNOSIS — Y929 Unspecified place or not applicable: Secondary | ICD-10-CM | POA: Insufficient documentation

## 2018-10-27 DIAGNOSIS — Y939 Activity, unspecified: Secondary | ICD-10-CM | POA: Insufficient documentation

## 2018-10-27 DIAGNOSIS — Z79899 Other long term (current) drug therapy: Secondary | ICD-10-CM | POA: Insufficient documentation

## 2018-10-27 DIAGNOSIS — S93601A Unspecified sprain of right foot, initial encounter: Secondary | ICD-10-CM | POA: Diagnosis not present

## 2018-10-27 DIAGNOSIS — Y999 Unspecified external cause status: Secondary | ICD-10-CM | POA: Diagnosis not present

## 2018-10-27 DIAGNOSIS — M79671 Pain in right foot: Secondary | ICD-10-CM

## 2018-10-27 DIAGNOSIS — S99921A Unspecified injury of right foot, initial encounter: Secondary | ICD-10-CM | POA: Diagnosis present

## 2018-10-27 MED ORDER — HYDROCODONE-ACETAMINOPHEN 5-325 MG PO TABS
1.0000 | ORAL_TABLET | Freq: Once | ORAL | Status: AC
  Administered 2018-10-27: 03:00:00 1 via ORAL
  Filled 2018-10-27: qty 1

## 2018-10-27 MED ORDER — IBUPROFEN 800 MG PO TABS: 800.0000 mg | ORAL_TABLET | Freq: Three times a day (TID) | ORAL | 0 refills | Status: DC | PRN

## 2018-10-27 MED ORDER — HYDROCODONE-ACETAMINOPHEN 5-325 MG PO TABS: 1.0000 | ORAL_TABLET | Freq: Four times a day (QID) | ORAL | 0 refills | Status: DC | PRN

## 2018-10-27 MED ORDER — IBUPROFEN 800 MG PO TABS
800.0000 mg | ORAL_TABLET | Freq: Once | ORAL | Status: AC
  Administered 2018-10-27: 03:00:00 800 mg via ORAL
  Filled 2018-10-27: qty 1

## 2018-10-27 NOTE — ED Notes (Signed)
Reviewed discharge instructions, follow-up care, elevation, cryotherapy, and crutch use, and prescriptions with patient. Patient verbalized understanding of all information reviewed. Patient stable, with no distress noted at this time.

## 2018-10-27 NOTE — Discharge Instructions (Addendum)
1.  You may take pain medicines as needed (Motrin/Norco #15). 2.  You may remove Velcro splint as needed. 3.  Use crutches to help you walk.  You may put weight on the right foot as tolerated. 4.  Elevate affected area and apply ice several times daily to reduce swelling. 5.  Return to the ER for worsening symptoms, persistent vomiting, difficulty breathing or other concerns.

## 2018-10-27 NOTE — ED Notes (Signed)
Right ankle splint placed by Myah, NT and Kennedy Bucker, NT. Pt tolerated well with no issue.

## 2018-10-27 NOTE — ED Triage Notes (Signed)
Patient reports fell earlier tonight and reports pain to her right foot.
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
## 2019-02-06 ENCOUNTER — Ambulatory Visit (INDEPENDENT_AMBULATORY_CARE_PROVIDER_SITE_OTHER): Payer: Self-pay | Admitting: Maternal Newborn

## 2019-02-06 ENCOUNTER — Other Ambulatory Visit (HOSPITAL_COMMUNITY)
Admission: RE | Admit: 2019-02-06 | Discharge: 2019-02-06 | Disposition: A | Discharge status: Home / Self Care | Payer: BC Managed Care – PPO | Source: Ambulatory Visit | Attending: Maternal Newborn | Admitting: Maternal Newborn

## 2019-02-06 ENCOUNTER — Encounter: Payer: Self-pay | Admitting: Maternal Newborn

## 2019-02-06 ENCOUNTER — Other Ambulatory Visit: Payer: Self-pay

## 2019-02-06 VITALS — BP 118/80 | Ht 67.0 in | Wt 218.2 lb

## 2019-02-06 DIAGNOSIS — N9089 Other specified noninflammatory disorders of vulva and perineum: Secondary | ICD-10-CM | POA: Insufficient documentation

## 2019-02-06 DIAGNOSIS — N898 Other specified noninflammatory disorders of vagina: Secondary | ICD-10-CM

## 2019-02-06 DIAGNOSIS — B373 Candidiasis of vulva and vagina: Secondary | ICD-10-CM

## 2019-02-06 DIAGNOSIS — R399 Unspecified symptoms and signs involving the genitourinary system: Secondary | ICD-10-CM

## 2019-02-06 DIAGNOSIS — B3731 Acute candidiasis of vulva and vagina: Secondary | ICD-10-CM

## 2019-02-06 DIAGNOSIS — N611 Abscess of the breast and nipple: Secondary | ICD-10-CM

## 2019-02-06 DIAGNOSIS — Z113 Encounter for screening for infections with a predominantly sexual mode of transmission: Secondary | ICD-10-CM

## 2019-02-06 LAB — POCT URINALYSIS DIPSTICK
Bilirubin, UA: NEGATIVE
Blood, UA: NEGATIVE
Glucose, UA: NEGATIVE
Ketones, UA: NEGATIVE
Leukocytes, UA: NEGATIVE
Nitrite, UA: NEGATIVE
Protein, UA: POSITIVE — AB
Spec Grav, UA: 1.01 (ref 1.010–1.025)
Urobilinogen, UA: NEGATIVE E.U./dL — AB
pH, UA: 7.5 (ref 5.0–8.0)

## 2019-02-06 LAB — POCT WET PREP (WET MOUNT)
Clue Cells Wet Prep Whiff POC: NEGATIVE
Trichomonas Wet Prep HPF POC: ABSENT

## 2019-02-06 MED ORDER — FLUCONAZOLE 150 MG PO TABS: 150.0000 mg | ORAL_TABLET | Freq: Once | ORAL | 0 refills | Status: AC

## 2019-02-06 NOTE — Progress Notes (Signed)
Obstetrics & Gynecology Office Visit   Chief Complaint:  Chief Complaint  Patient presents with  . Breast exam    lump on right breast, painful when touches noticed this past Sat  . Urinary Tract Infection    frequency and burning when urinates, strong odor in urine no blood/pain  . Vaginal Itching    no discharge, no sour/fishy odor    History of Present Illness: Donna Pittman has noticed some burning when she urinates and vulvar itching that began last Friday (7/31) and got worse on Saturday; these symptoms have persisted. She also has vulvar irritation. She did have intercourse using a condom on Thursday and wonders if this could have something to do with her symptoms. She has tried using Monistat without relief. She has not noticed a thick or thin vaginal discharge or any odor. She has not had pelvic pain or cramping.  She has also noticed a lump on the underside of her right breast, slightly above her bra line. She was able to express some blood and pus from the site. She has been using Neosporin on it. It is tender to the touch. She thought that it may be a boil, but wanted to be sure that it was not related to a breast mass.   Review of Systems: Review of systems negative unless otherwise noted in HPI.  Past Medical History:  Past Medical History:  Diagnosis Date  . Obesity (BMI 30.0-34.9)     Past Surgical History:  Past Surgical History:  Procedure Laterality Date  . DILATION AND CURETTAGE OF UTERUS  2015   missed abortion    Gynecologic History: No LMP recorded. Patient has had an implant.  Obstetric History: [id number]  Family History:  Family History  Problem Relation Age of Onset  . Diabetes Maternal Grandmother   . Hypertension Maternal Grandmother   . Stroke Maternal Grandmother   . Diabetes Paternal Grandmother   . Hypertension Paternal Grandmother   . Leukemia Paternal Grandmother     Social History:  Social History   Socioeconomic History  . Marital  status: Single    Spouse name: Not on file  . Number of children: 1  . Years of education: 3415  . Highest education level: Not on file  Occupational History  . Occupation: ER claims  Social Needs  . Financial resource strain: Not on file  . Food insecurity    Worry: Not on file    Inability: Not on file  . Transportation needs    Medical: Not on file    Non-medical: Not on file  Tobacco Use  . Smoking status: Never Smoker  . Smokeless tobacco: Never Used  Substance and Sexual Activity  . Alcohol use: No  . Drug use: No  . Sexual activity: Yes    Partners: Male    Birth control/protection: Implant    Comment: Nexplanon  Lifestyle  . Physical activity    Days per week: 0 days    Minutes per session: 0 min  . Stress: Not at all  Relationships  . Social connections    Talks on phone: More than three times a week    Gets together: Three times a week    Attends religious service: More than 4 times per year    Active member of club or organization: No    Attends meetings of clubs or organizations: Never    Relationship status: Never married  . Intimate partner violence    Fear of current or  ex partner: No    Emotionally abused: No    Physically abused: No    Forced sexual activity: No  Other Topics Concern  . Not on file  Social History Narrative  . Not on file    Allergies:  No Known Allergies  Medications: Prior to Admission medications   Medication Sig Start Date End Date Taking? Authorizing Provider  etonogestrel (IMPLANON) 68 MG IMPL implant Inject into the skin.   Yes [provider]  ibuprofen (ADVIL) 800 MG tablet Take 1 tablet (800 mg total) by mouth every 8 (eight) hours as needed for moderate pain. 10/27/18  Yes Paulette Blanch, MD    Physical Exam Vitals:  Vitals:   02/06/19 1109  BP: 118/80   No LMP recorded. Patient has had an implant.  General: NAD Pulmonary: No increased work of breathing Breasts: Healing pustule visible on underside  of right breast, tender to palpation, about 0.5 cm in size. Otherwise, right breast normal without mass, skin or nipple changes or axillary nodes. Genitourinary:  External: Vulvar irritation, moderate amount of thin white  Discharge. Normal external female genitalia.  Normal  urethral meatus, normal Bartholin's and Skene's glands.    Vagina: Normal vaginal mucosa, no evidence of prolapse.   Neurologic: Grossly intact Psychiatric: mood appropriate, affect full  Wet Prep: PH: Less than 4.5 Clue Cells: Negative, negative whiff Fungal elements: Positive Trichomonas: Negative  Assessment: 28 y.o. [id number] with a yeast infection, healing boil on right breast, STI screening.  Plan: Problem List Items Addressed This Visit    None    Visit Diagnoses    Vaginal itching    -  Primary   Relevant Orders   Cervicovaginal ancillary only   POCT Wet Prep [facility])   UTI symptoms       Relevant Orders   Urine Culture   Boil, breast       Relevant Medications   fluconazole (DIFLUCAN) 150 MG tablet   Vulvar irritation       Relevant Orders   Cervicovaginal ancillary only   POCT Wet Prep ([facility] [place])   Vulvovaginal candidiasis       Relevant Medications   fluconazole (DIFLUCAN) 150 MG tablet   Routine screening for STI (sexually transmitted infection)       Relevant Orders   Cervicovaginal ancillary only     1) Diflucan to treat yeast as seen on wet prep.  2) Aptima sent to screen for STI and co-infection with BV.  3) Continue Neosporin for boil, area does not appear infected, but asked patient to call with any signs of developing infection.  4) Urine culture sent to rule out UTI; UA negative.  Avel Sensor, CNM 02/06/2019  11:56 AM

## 2019-02-06 NOTE — Addendum Note (Signed)
Addended by: Drenda Freeze on: 02/06/2019 03:12 PM   Modules accepted: Orders

## 2019-02-08 LAB — URINE CULTURE

## 2019-02-11 LAB — CERVICOVAGINAL ANCILLARY ONLY
Bacterial vaginitis: POSITIVE — AB
Chlamydia: NEGATIVE
Neisseria Gonorrhea: NEGATIVE
Trichomonas: NEGATIVE

## 2019-02-13 ENCOUNTER — Other Ambulatory Visit: Payer: Self-pay | Admitting: Maternal Newborn

## 2019-02-13 DIAGNOSIS — B9689 Other specified bacterial agents as the cause of diseases classified elsewhere: Secondary | ICD-10-CM

## 2019-02-13 MED ORDER — METRONIDAZOLE 500 MG PO TABS: 500.0000 mg | ORAL_TABLET | Freq: Two times a day (BID) | ORAL | 0 refills | Status: AC

## 2019-02-13 NOTE — Progress Notes (Signed)
Rx sent for Flagyl
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
## 2019-07-23 NOTE — Patient Instructions (Signed)
I value your feedback and entrusting us with your care. If you get a [facility] patient survey, I would appreciate you taking the time to let us know about your experience today. Thank you!  As of June 13, 2019, your lab results will be released to your MyChart immediately, before I even have a chance to see them. Please give me time to review them and contact you if there are any abnormalities. Thank you for your patience.  

## 2019-07-24 ENCOUNTER — Other Ambulatory Visit (HOSPITAL_COMMUNITY)
Admission: RE | Admit: 2019-07-24 | Discharge: 2019-07-24 | Disposition: A | Discharge status: Home / Self Care | Payer: BC Managed Care – PPO | Source: Ambulatory Visit | Attending: Obstetrics and Gynecology | Admitting: Obstetrics and Gynecology

## 2019-07-24 ENCOUNTER — Encounter: Payer: Self-pay | Admitting: Obstetrics and Gynecology

## 2019-07-24 ENCOUNTER — Ambulatory Visit (INDEPENDENT_AMBULATORY_CARE_PROVIDER_SITE_OTHER): Payer: BC Managed Care – PPO | Admitting: Obstetrics and Gynecology

## 2019-07-24 ENCOUNTER — Other Ambulatory Visit: Payer: Self-pay

## 2019-07-24 VITALS — BP 100/80 | Ht 67.0 in | Wt 223.0 lb

## 2019-07-24 DIAGNOSIS — Z113 Encounter for screening for infections with a predominantly sexual mode of transmission: Secondary | ICD-10-CM | POA: Insufficient documentation

## 2019-07-24 DIAGNOSIS — Z23 Encounter for immunization: Secondary | ICD-10-CM

## 2019-07-24 DIAGNOSIS — Z3046 Encounter for surveillance of implantable subdermal contraceptive: Secondary | ICD-10-CM

## 2019-07-24 DIAGNOSIS — Z01419 Encounter for gynecological examination (general) (routine) without abnormal findings: Secondary | ICD-10-CM | POA: Diagnosis not present

## 2019-07-25 LAB — CERVICOVAGINAL ANCILLARY ONLY
Chlamydia: NEGATIVE
Comment: NEGATIVE
Comment: NORMAL
Neisseria Gonorrhea: NEGATIVE

## 2019-12-13 IMAGING — CR RIGHT FOOT COMPLETE - 3+ VIEW
3 series · 3 of 3 positions shown · non-contrast
Comparison: None.

CLINICAL DATA: Right foot pain.  Fall.

EXAM:
RIGHT FOOT COMPLETE - 3+ VIEW

[foot ap]
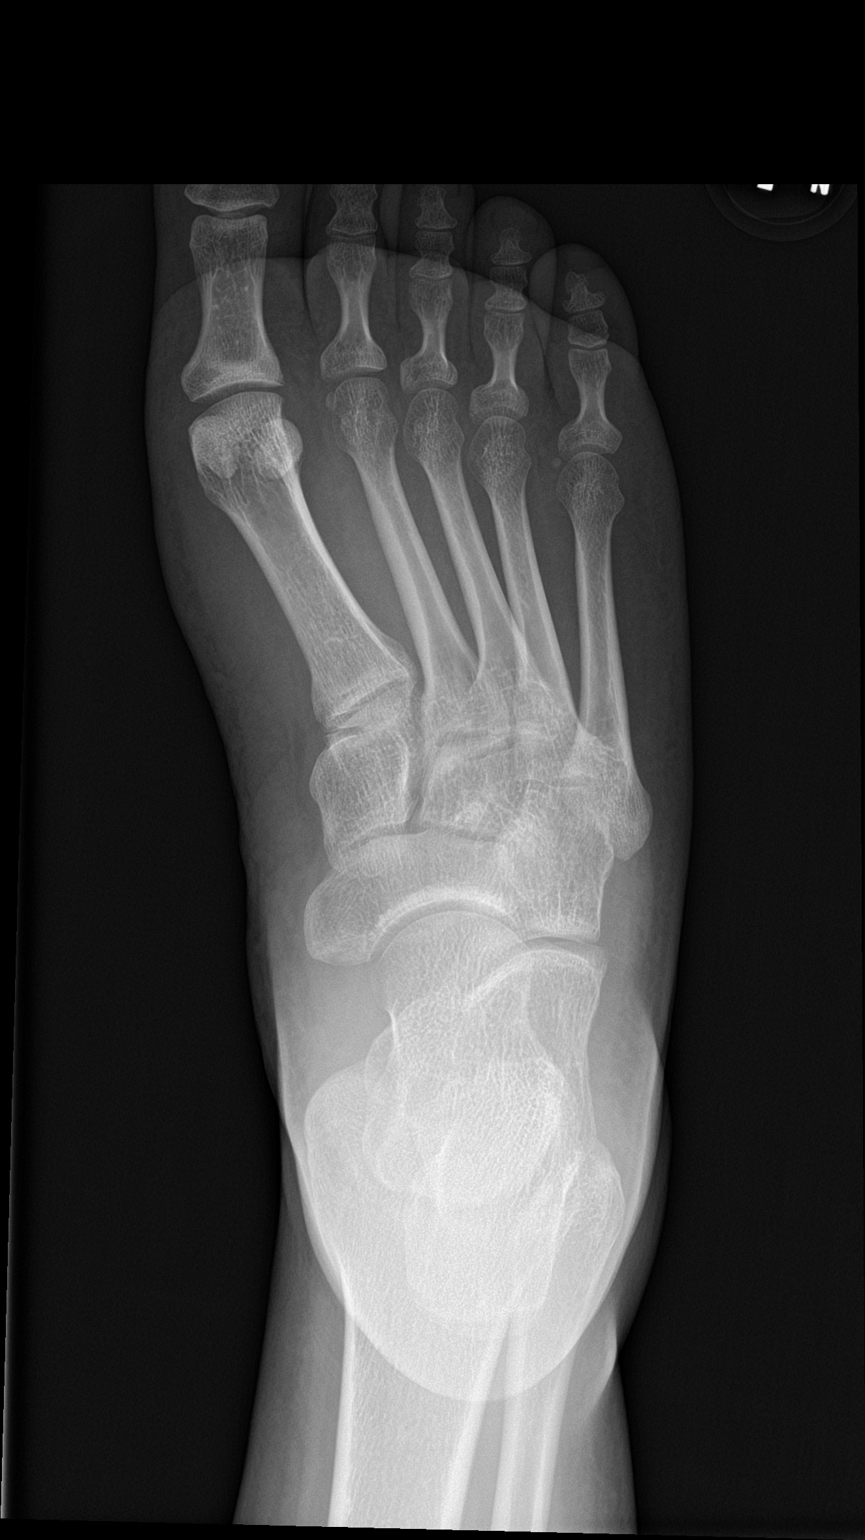

[foot obl]
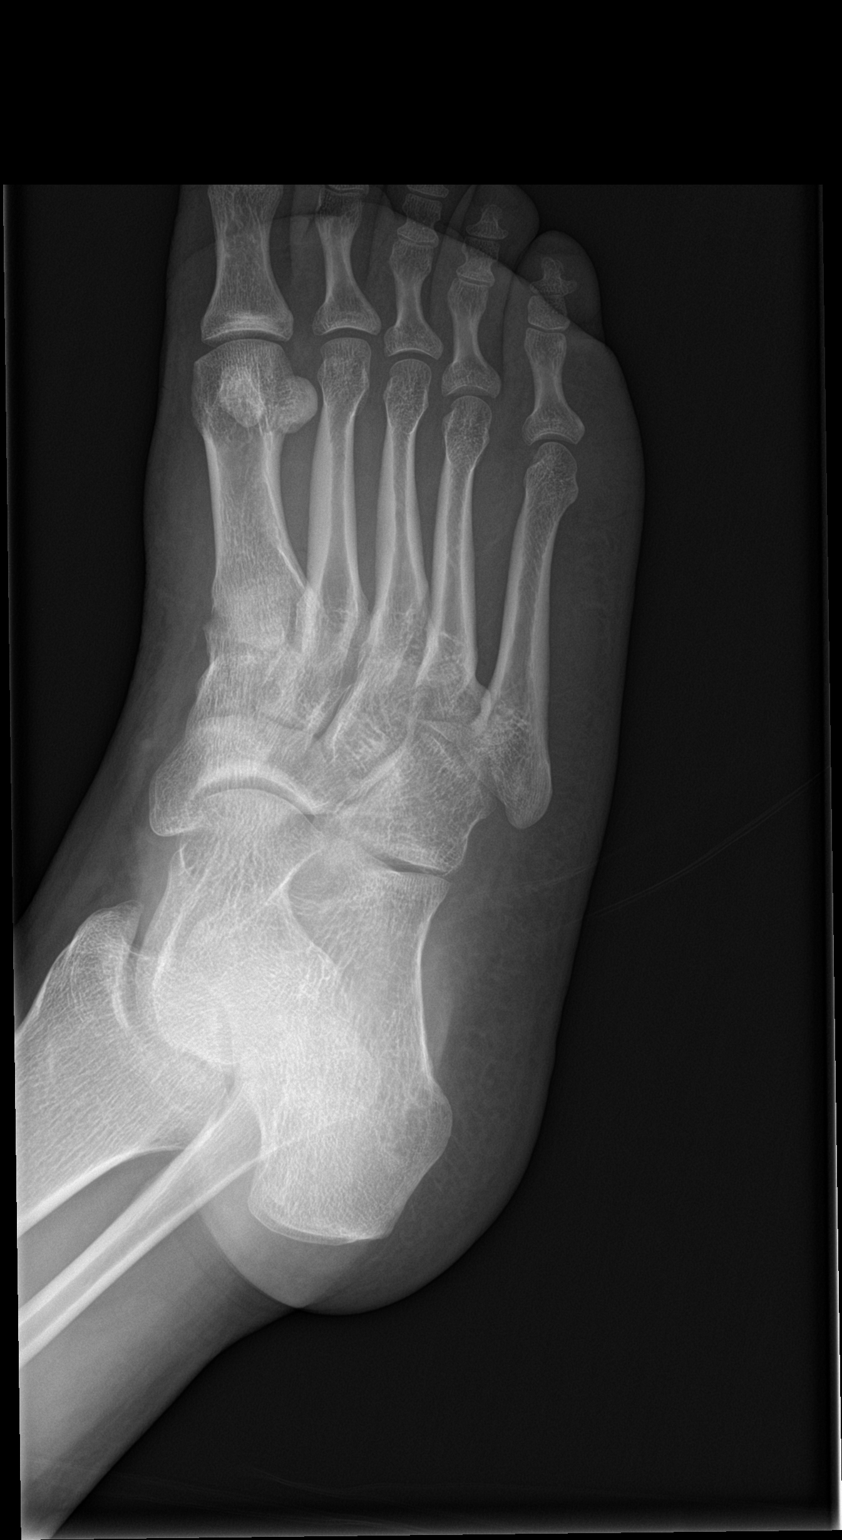

[foot lat]
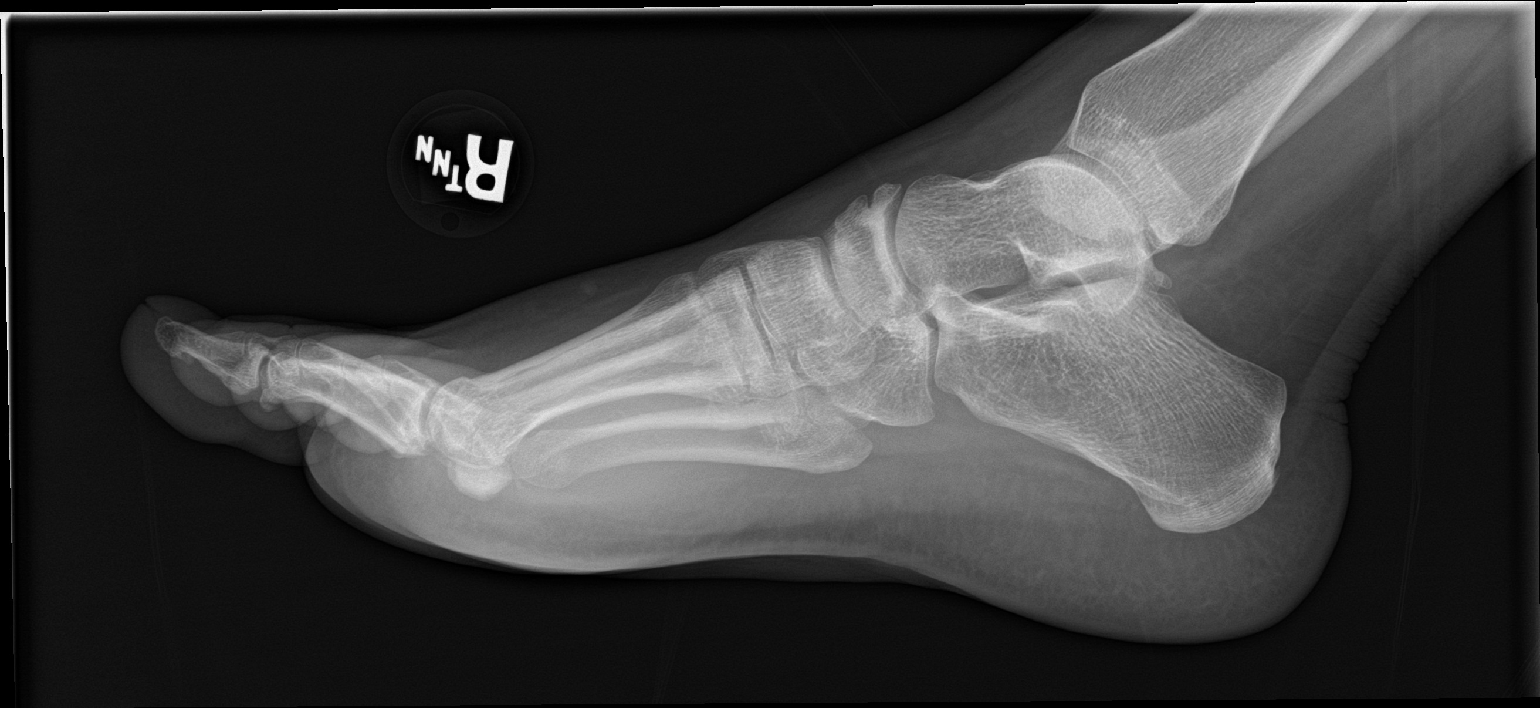

[3 of 3 positions shown; findings below may reference images not displayed]

FINDINGS: There is no evidence of fracture or dislocation. There is no
evidence of arthropathy or other focal bone abnormality. Soft
tissues are unremarkable.
IMPRESSION: Negative.

## 2020-08-05 NOTE — Telephone Encounter (Signed)
Nexplanon rcvd/charged 07/25/18
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
## 2020-09-17 ENCOUNTER — Encounter: Payer: Self-pay | Admitting: Obstetrics and Gynecology

## 2020-09-17 ENCOUNTER — Other Ambulatory Visit (HOSPITAL_COMMUNITY)
Admission: RE | Admit: 2020-09-17 | Discharge: 2020-09-17 | Disposition: A | Discharge status: Home / Self Care | Payer: 59 | Source: Ambulatory Visit | Attending: Obstetrics and Gynecology | Admitting: Obstetrics and Gynecology

## 2020-09-17 ENCOUNTER — Ambulatory Visit (INDEPENDENT_AMBULATORY_CARE_PROVIDER_SITE_OTHER): Payer: 59 | Admitting: Obstetrics and Gynecology

## 2020-09-17 ENCOUNTER — Other Ambulatory Visit: Payer: Self-pay

## 2020-09-17 VITALS — BP 110/80 | Ht 67.0 in | Wt 225.0 lb

## 2020-09-17 DIAGNOSIS — Z113 Encounter for screening for infections with a predominantly sexual mode of transmission: Secondary | ICD-10-CM | POA: Diagnosis not present

## 2020-09-17 DIAGNOSIS — Z3046 Encounter for surveillance of implantable subdermal contraceptive: Secondary | ICD-10-CM

## 2020-09-17 DIAGNOSIS — Z975 Presence of (intrauterine) contraceptive device: Secondary | ICD-10-CM | POA: Diagnosis present

## 2020-09-17 DIAGNOSIS — N921 Excessive and frequent menstruation with irregular cycle: Secondary | ICD-10-CM | POA: Insufficient documentation

## 2020-09-17 DIAGNOSIS — F419 Anxiety disorder, unspecified: Secondary | ICD-10-CM

## 2020-09-17 MED ORDER — NORETHINDRONE 0.35 MG PO TABS: 1.0000 | ORAL_TABLET | Freq: Every day | ORAL | 0 refills | Status: AC

## 2020-09-17 MED ORDER — SERTRALINE HCL 50 MG PO TABS: ORAL_TABLET | ORAL | 0 refills | Status: AC

## 2020-09-17 NOTE — Patient Instructions (Addendum)
I value your feedback and you entrusting us with your care. If you get a [facility] patient survey, I would appreciate you taking the time to let us know about your experience today. Thank you!  Remove the dressing in 24 hours,  keep the incision area dry for 24 hours and remove the Steristrip in 2-3  days.  Notify us if any signs of tenderness, redness, pain, or fevers develop.   

## 2020-09-18 LAB — CERVICOVAGINAL ANCILLARY ONLY
Chlamydia: NEGATIVE
Comment: NEGATIVE
Comment: NORMAL
Neisseria Gonorrhea: NEGATIVE
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
## 2020-10-02 NOTE — Patient Instructions (Incomplete)
I value your feedback and you entrusting us with your care. If you get a [facility] patient survey, I would appreciate you taking the time to let us know about your experience today. Thank you! ? ? ?

## 2020-10-05 ENCOUNTER — Ambulatory Visit: Payer: BC Managed Care – PPO | Admitting: Obstetrics and Gynecology

## 2020-10-05 DIAGNOSIS — Z124 Encounter for screening for malignant neoplasm of cervix: Secondary | ICD-10-CM

## 2020-10-05 DIAGNOSIS — F419 Anxiety disorder, unspecified: Secondary | ICD-10-CM

## 2020-10-05 DIAGNOSIS — Z1151 Encounter for screening for human papillomavirus (HPV): Secondary | ICD-10-CM

## 2020-10-05 DIAGNOSIS — Z01419 Encounter for gynecological examination (general) (routine) without abnormal findings: Secondary | ICD-10-CM

## 2020-10-05 DIAGNOSIS — Z3046 Encounter for surveillance of implantable subdermal contraceptive: Secondary | ICD-10-CM

## 2020-10-05 DIAGNOSIS — Z975 Presence of (intrauterine) contraceptive device: Secondary | ICD-10-CM

## 2020-10-13 ENCOUNTER — Ambulatory Visit: Payer: 59 | Admitting: Obstetrics
